# Patient Record
Sex: Female | Born: 1950
Health system: Southern US, Community
[De-identification: ages and names within clinical notes are randomized; demographics above are authoritative.]

## PROBLEM LIST (undated history)

## (undated) DIAGNOSIS — T7840XA Allergy, unspecified, initial encounter: Secondary | ICD-10-CM

## (undated) DIAGNOSIS — I10 Essential (primary) hypertension: Secondary | ICD-10-CM

## (undated) DIAGNOSIS — R011 Cardiac murmur, unspecified: Secondary | ICD-10-CM

## (undated) DIAGNOSIS — E785 Hyperlipidemia, unspecified: Secondary | ICD-10-CM

## (undated) DIAGNOSIS — M1990 Unspecified osteoarthritis, unspecified site: Secondary | ICD-10-CM

## (undated) DIAGNOSIS — K219 Gastro-esophageal reflux disease without esophagitis: Secondary | ICD-10-CM

## (undated) DIAGNOSIS — D649 Anemia, unspecified: Secondary | ICD-10-CM

## (undated) DIAGNOSIS — I341 Nonrheumatic mitral (valve) prolapse: Secondary | ICD-10-CM

## (undated) HISTORY — PX: OTHER SURGICAL HISTORY: SHX169

## (undated) HISTORY — DX: Essential (primary) hypertension: I10

## (undated) HISTORY — PX: COLONOSCOPY: SHX174

## (undated) HISTORY — DX: Hyperlipidemia, unspecified: E78.5

## (undated) HISTORY — DX: Anemia, unspecified: D64.9

## (undated) HISTORY — DX: Gastro-esophageal reflux disease without esophagitis: K21.9

## (undated) HISTORY — DX: Nonrheumatic mitral (valve) prolapse: I34.1

## (undated) HISTORY — DX: Allergy, unspecified, initial encounter: T78.40XA

## (undated) HISTORY — DX: Cardiac murmur, unspecified: R01.1

## (undated) HISTORY — PX: APPENDECTOMY: SHX54

## (undated) HISTORY — DX: Unspecified osteoarthritis, unspecified site: M19.90

---

## 2000-01-27 ENCOUNTER — Other Ambulatory Visit: Admission: RE | Admit: 2000-01-27 | Discharge: 2000-01-27 | Payer: Self-pay | Admitting: Obstetrics and Gynecology

## 2000-09-11 ENCOUNTER — Encounter: Payer: Self-pay | Admitting: Emergency Medicine

## 2000-09-11 ENCOUNTER — Emergency Department (HOSPITAL_COMMUNITY): Admission: EM | Admit: 2000-09-11 | Discharge: 2000-09-11 | Payer: Self-pay | Admitting: Emergency Medicine

## 2001-02-13 ENCOUNTER — Other Ambulatory Visit: Admission: RE | Admit: 2001-02-13 | Discharge: 2001-02-13 | Payer: Self-pay | Admitting: Obstetrics and Gynecology

## 2001-09-06 ENCOUNTER — Other Ambulatory Visit: Admission: RE | Admit: 2001-09-06 | Discharge: 2001-09-06 | Payer: Self-pay | Admitting: Obstetrics and Gynecology

## 2002-02-14 ENCOUNTER — Other Ambulatory Visit: Admission: RE | Admit: 2002-02-14 | Discharge: 2002-02-14 | Payer: Self-pay | Admitting: Obstetrics and Gynecology

## 2002-07-23 ENCOUNTER — Other Ambulatory Visit: Admission: RE | Admit: 2002-07-23 | Discharge: 2002-07-23 | Payer: Self-pay | Admitting: Obstetrics and Gynecology

## 2003-04-15 ENCOUNTER — Other Ambulatory Visit: Admission: RE | Admit: 2003-04-15 | Discharge: 2003-04-15 | Payer: Self-pay | Admitting: Obstetrics and Gynecology

## 2003-04-16 ENCOUNTER — Encounter: Payer: Self-pay | Admitting: Obstetrics and Gynecology

## 2003-04-16 ENCOUNTER — Ambulatory Visit (HOSPITAL_COMMUNITY): Admission: RE | Admit: 2003-04-16 | Discharge: 2003-04-16 | Payer: Self-pay | Admitting: Obstetrics and Gynecology

## 2003-05-22 ENCOUNTER — Emergency Department (HOSPITAL_COMMUNITY): Admission: EM | Admit: 2003-05-22 | Discharge: 2003-05-22 | Payer: Self-pay | Admitting: Emergency Medicine

## 2003-05-22 ENCOUNTER — Encounter: Payer: Self-pay | Admitting: Emergency Medicine

## 2004-05-17 ENCOUNTER — Other Ambulatory Visit: Admission: RE | Admit: 2004-05-17 | Discharge: 2004-05-17 | Payer: Self-pay | Admitting: Obstetrics and Gynecology

## 2005-08-20 ENCOUNTER — Emergency Department (HOSPITAL_COMMUNITY): Admission: EM | Admit: 2005-08-20 | Discharge: 2005-08-20 | Payer: Self-pay | Admitting: Emergency Medicine

## 2005-12-16 ENCOUNTER — Emergency Department (HOSPITAL_COMMUNITY): Admission: EM | Admit: 2005-12-16 | Discharge: 2005-12-16 | Payer: Self-pay | Admitting: Emergency Medicine

## 2006-10-22 ENCOUNTER — Emergency Department (HOSPITAL_COMMUNITY): Admission: EM | Admit: 2006-10-22 | Discharge: 2006-10-23 | Payer: Self-pay | Admitting: Emergency Medicine

## 2007-02-13 ENCOUNTER — Other Ambulatory Visit: Admission: RE | Admit: 2007-02-13 | Discharge: 2007-02-13 | Payer: Self-pay | Admitting: Obstetrics & Gynecology

## 2007-10-09 ENCOUNTER — Emergency Department (HOSPITAL_COMMUNITY): Admission: EM | Admit: 2007-10-09 | Discharge: 2007-10-10 | Payer: Self-pay | Admitting: Emergency Medicine

## 2008-02-25 ENCOUNTER — Emergency Department (HOSPITAL_COMMUNITY): Admission: EM | Admit: 2008-02-25 | Discharge: 2008-02-25 | Payer: Self-pay | Admitting: Emergency Medicine

## 2008-05-27 ENCOUNTER — Emergency Department (HOSPITAL_COMMUNITY): Admission: EM | Admit: 2008-05-27 | Discharge: 2008-05-27 | Payer: Self-pay | Admitting: Family Medicine

## 2009-03-30 ENCOUNTER — Emergency Department (HOSPITAL_COMMUNITY): Admission: EM | Admit: 2009-03-30 | Discharge: 2009-03-30 | Payer: Self-pay | Admitting: Family Medicine

## 2009-04-12 ENCOUNTER — Emergency Department (HOSPITAL_COMMUNITY): Admission: EM | Admit: 2009-04-12 | Discharge: 2009-04-12 | Payer: Self-pay | Admitting: Emergency Medicine

## 2009-09-24 ENCOUNTER — Encounter: Admission: RE | Admit: 2009-09-24 | Discharge: 2009-09-24 | Payer: Self-pay | Admitting: Orthopaedic Surgery

## 2009-09-30 ENCOUNTER — Emergency Department (HOSPITAL_COMMUNITY): Admission: EM | Admit: 2009-09-30 | Discharge: 2009-09-30 | Payer: Self-pay | Admitting: Emergency Medicine

## 2011-06-14 ENCOUNTER — Encounter: Payer: Self-pay | Admitting: Gastroenterology

## 2011-06-21 ENCOUNTER — Encounter: Payer: Self-pay | Admitting: Gastroenterology

## 2011-06-21 ENCOUNTER — Ambulatory Visit (INDEPENDENT_AMBULATORY_CARE_PROVIDER_SITE_OTHER): Payer: 59 | Admitting: Gastroenterology

## 2011-06-21 VITALS — BP 124/76 | HR 80 | Ht 65.0 in | Wt 178.0 lb

## 2011-06-21 DIAGNOSIS — K625 Hemorrhage of anus and rectum: Secondary | ICD-10-CM

## 2011-06-21 MED ORDER — PEG-KCL-NACL-NASULF-NA ASC-C 100 G PO SOLR: 1.0000 | Freq: Once | ORAL | Status: DC

## 2011-06-21 MED ORDER — HYDROCORTISONE ACETATE 25 MG RE SUPP: 25.0000 mg | Freq: Two times a day (BID) | RECTAL | Status: DC

## 2011-06-21 NOTE — Assessment & Plan Note (Addendum)
Rectal bleeding is most likely secondary to hemorrhoids. A more proximal colonic bleeding source should be ruled out.  Recommendations #1 Anusol HC suppositories #2 colonoscopy  Risks, alternatives, and complications of the procedure, including bleeding, perforation, and possible need for surgery, were explained to the patient.  Patient's questions were answered.

## 2011-06-21 NOTE — Progress Notes (Signed)
History of Present Illness:  Ms. Drum is a pleasant 60 year old African American female referred at the request of Dr. Clelia Croft for evaluation of rectal bleeding. On several occasions she has had limited painless bright red blood per rectum. She denies change in bowel habits, abdominal pain or rectal pain. She apparently underwent colonoscopy approximately 9 years ago.    Review of Systems: She has low back pain some urinary leakage Pertinent positive and negative review of systems were noted in the above HPI section. All other review of systems were otherwise negative.    Current Medications, Allergies, Past Medical History, Past Surgical History, Family History and Social History were reviewed in [employer] [facility] electronic medical record  Vital signs were reviewed in today's medical record. Physical Exam: General: Well developed , well nourished, no acute distress Head: Normocephalic and atraumatic Eyes:  sclerae anicteric, EOMI Ears: Normal auditory acuity Mouth: No deformity or lesions Lungs: Clear throughout to auscultation Heart: Regular rate and rhythm; no murmurs, rubs or bruits Abdomen: Soft, non tender and non distended. No masses, hepatosplenomegaly or hernias noted. Normal Bowel sounds Rectal: Small external hemorrhoids are present Musculoskeletal: Symmetrical with no gross deformities  Pulses:  Normal pulses noted Extremities: No clubbing, cyanosis, edema or deformities noted Neurological: Alert oriented x 4, grossly nonfocal Psychological:  Alert and cooperative. Normal mood and affect

## 2011-06-21 NOTE — Patient Instructions (Signed)
Colon [facility] 07/07/11 2:00 pm arrive at 1:00 pm on 4th floor Moviprep sent to pharmacy for you to pick up. Colonoscopy A colonoscopy is an exam to evaluate your entire colon. In this exam, your colon is cleansed. A long fiberoptic tube is inserted through your rectum and into your colon. The fiberoptic scope (endoscope) is a long bundle of enclosed and very flexible fibers. These fibers transmit light to the area examined and send images from that area to your caregiver. Discomfort is usually minimal. You may be given a drug to help you sleep (sedative) during or prior to the procedure. This exam helps to detect lumps (tumors), polyps, inflammation, and areas of bleeding. Your caregiver may also take a small piece of tissue (biopsy) that will be examined under a microscope. BEFORE THE PROCEDURE  A clear liquid diet may be required for 2 days before the exam.   Liquid injections (enemas) or laxatives may be required.   A large amount of electrolyte solution may be given to you to drink over a short period of time. This solution is used to clean out your colon.   You should be present 1 hour prior to your procedure or as directed by your caregiver.   Check in at the admissions desk to fill out necessary forms if not preregistered. There will be consent forms to sign prior to the procedure. If accompanied by friends or family, there is a waiting area for them while you are having your procedure.  LET YOUR CAREGIVER KNOW ABOUT:  Allergies to food or medicine.  Medicines taken, including vitamins, herbs, eyedrops, over-the-counter medicines, and creams.   Use of steroids (by mouth or creams).   Previous problems with anesthetics or numbing medicines.   History of bleeding problems or blood clots.  Previous surgery.   Other health problems, including diabetes and kidney problems.   Possibility of pregnancy, if this applies.   AFTER THE PROCEDURE  If you received a sedative and/or pain  medicine, you will need to arrange for someone to drive you home.   Occasionally, there is a little blood passed with the first bowel movement. DO NOT be concerned.  HOME CARE INSTRUCTIONS  It is not unusual to pass moderate amounts of gas and experience mild abdominal cramping following the procedure. This is due to air being used to inflate your colon during the exam. Walking or a warm pack on your belly (abdomen) may help.   You may resume all normal meals and activities after sedatives and medicines have worn off.   Only take over-the-counter or prescription medicines for pain, discomfort, or fever as directed by your caregiver. DO NOT use aspirin or blood thinners if a biopsy was taken. Consult your caregiver for medicine usage if biopsies were taken.  FINDING OUT THE RESULTS OF YOUR TEST Not all test results are available during your visit. If your test results are not back during the visit, make an appointment with your caregiver to find out the results. Do not assume everything is normal if you have not heard from your caregiver or the medical facility. It is important for you to follow up on all of your test results. SEEK IMMEDIATE MEDICAL CARE IF:  You have an oral temperature above 100.0, not controlled by medicine.   You pass large blood clots or fill a toilet with blood following the procedure. This may also occur 10 to 14 days following the procedure. This is more likely if a biopsy was taken.  You develop abdominal pain that keeps getting worse and cannot be relieved with medicine.  Document Released: 08/25/2000 Document Re-Released: 11/22/2009 [facility] Patient Information 2011 [place], [place].

## 2011-07-07 ENCOUNTER — Other Ambulatory Visit: Payer: 59 | Admitting: Gastroenterology

## 2011-08-20 ENCOUNTER — Emergency Department (HOSPITAL_COMMUNITY)
Admission: EM | Admit: 2011-08-20 | Discharge: 2011-08-21 | Disposition: A | Discharge status: Home / Self Care | Payer: 59 | Attending: Emergency Medicine | Admitting: Emergency Medicine

## 2011-08-20 ENCOUNTER — Other Ambulatory Visit: Payer: Self-pay

## 2011-08-20 ENCOUNTER — Encounter (HOSPITAL_COMMUNITY): Payer: Self-pay | Admitting: *Deleted

## 2011-08-20 ENCOUNTER — Emergency Department (HOSPITAL_COMMUNITY): Payer: 59

## 2011-08-20 DIAGNOSIS — R5383 Other fatigue: Secondary | ICD-10-CM | POA: Insufficient documentation

## 2011-08-20 DIAGNOSIS — R209 Unspecified disturbances of skin sensation: Secondary | ICD-10-CM | POA: Insufficient documentation

## 2011-08-20 DIAGNOSIS — E785 Hyperlipidemia, unspecified: Secondary | ICD-10-CM | POA: Insufficient documentation

## 2011-08-20 DIAGNOSIS — Z79899 Other long term (current) drug therapy: Secondary | ICD-10-CM | POA: Insufficient documentation

## 2011-08-20 DIAGNOSIS — R5381 Other malaise: Secondary | ICD-10-CM | POA: Insufficient documentation

## 2011-08-20 DIAGNOSIS — R200 Anesthesia of skin: Secondary | ICD-10-CM

## 2011-08-20 DIAGNOSIS — R51 Headache: Secondary | ICD-10-CM | POA: Insufficient documentation

## 2011-08-20 DIAGNOSIS — I10 Essential (primary) hypertension: Secondary | ICD-10-CM | POA: Insufficient documentation

## 2011-08-20 LAB — POCT I-STAT, CHEM 8
Calcium, Ion: 1.19 mmol/L (ref 1.12–1.32)
Chloride: 106 mEq/L (ref 96–112)
Glucose, Bld: 126 mg/dL — ABNORMAL HIGH (ref 70–99)

## 2011-08-20 LAB — DIFFERENTIAL
Basophils Absolute: 0 10*3/uL (ref 0.0–0.1)
Basophils Relative: 1 % (ref 0–1)
Lymphs Abs: 2.6 10*3/uL (ref 0.7–4.0)
Neutrophils Relative %: 40 % — ABNORMAL LOW (ref 43–77)

## 2011-08-20 LAB — CBC
HCT: 37.1 % (ref 36.0–46.0)
Hemoglobin: 12.4 g/dL (ref 12.0–15.0)
MCH: 29.3 pg (ref 26.0–34.0)
WBC: 5.7 10*3/uL (ref 4.0–10.5)

## 2011-08-20 NOTE — Progress Notes (Signed)
60 year old female has had some twitching of her left eye for the last week. This morning she had an episode where she had a dull pain in the left side of her face which seem to be centered behind her left eye. This lasted for about 40 minutes before resolving. She's had no further pain. You do difficulty talking and didn't noted no facial droop. She denies any arm or leg symptoms. Her exam is completely normal with no evidence of any cranial nerve deficits. CT scan is negative. The specific etiology is unclear at this point however I am considering the possibility that this is the early stages of trigeminal neuralgia or Bell's palsy. However, with no ongoing symptoms, and no treatment is indicated at this point. I have explained this to the patient and advised her of signs and symptoms which might lead to diagnosis of either trigeminal neuralgia or Bell's palsy.

## 2011-08-20 NOTE — ED Provider Notes (Signed)
History     CSN: [id number] Arrival date & time: 08/20/2011  9:53 PM   First MD Initiated Contact with Patient 08/20/11 2206      Chief Complaint  Patient presents with  . Facial Pain    (Consider location/radiation/quality/duration/timing/severity/associated sxs/prior treatment) HPI Comments: Belinda Morgan reports 30 minutes of left eye and cheek bone discomfort, without visual disturbance, ear pain, sinus congestion, rhinitis.  No peripheral weakness.  No ataxia.  No headache, but she does state during the 30 minute episode she felt generally weak.  The history is provided by the patient.    Past Medical History  Diagnosis Date  . GERD (gastroesophageal reflux disease)   . Hypertension   . Hyperlipemia   . Anemia     Hx of     Past Surgical History  Procedure Date  . Appendectomy     Family History  Problem Relation Age of Onset  . Diabetes Brother     x 3   . Colon cancer Neg Hx     History  Substance Use Topics  . Smoking status: Former Games developer  . Smokeless tobacco: Never Used  . Alcohol Use: No    OB History    Grav Para Term Preterm Abortions TAB SAB Ect Mult Living                  Review of Systems  HENT: Negative for congestion, sore throat, facial swelling, rhinorrhea and neck pain.   Eyes: Negative.  Negative for visual disturbance.  Respiratory: Negative.   Cardiovascular: Negative for chest pain.  Gastrointestinal: Negative.  Negative for nausea, vomiting and diarrhea.  Genitourinary: Negative for dysuria and vaginal bleeding.  Skin: Negative.   Neurological: Positive for weakness. Negative for dizziness, seizures and numbness.  Hematological: Negative.   Psychiatric/Behavioral: Negative.     Allergies  Review of patient's allergies indicates no known allergies.  Home Medications   Current Outpatient Rx  Name Route Sig Dispense Refill  . IBUPROFEN 200 MG PO CAPS Oral Take 1 capsule by mouth as needed.      Marland Kitchen TRAMADOL HCL 50 MG PO TABS  Oral Take 50 mg by mouth every 8 (eight) hours as needed.      . TRIAMTERENE-HCTZ 37.5-25 MG PO CAPS Oral Take 1 capsule by mouth every morning.        BP 137/71  Pulse 87  Temp(Src) 98.7 F (37.1 C) (Oral)  Resp 18  SpO2 100%  Physical Exam  ED Course  Procedures (including critical care time)  Labs Reviewed  DIFFERENTIAL - Abnormal; Notable for the following:    Neutrophils Relative 40 (*)    Lymphocytes Relative 47 (*)    All other components within normal limits  POCT I-STAT, CHEM 8 - Abnormal; Notable for the following:    Creatinine, Ser 1.20 (*)    Glucose, Bld 126 (*)    All other components within normal limits  CBC  I-STAT, CHEM 8  URINALYSIS, ROUTINE W REFLEX MICROSCOPIC   Ct Head Wo Contrast  08/20/2011  *RADIOLOGY REPORT*  Clinical Data: Headache with left facial numbness  CT HEAD WITHOUT CONTRAST  Technique:  Contiguous axial images were obtained from the base of the skull through the vertex without contrast.  Comparison: No comparison studies available.  Findings:  There is no evidence for acute hemorrhage, hydrocephalus, mass lesion, or abnormal extra-axial fluid collection.  No definite CT evidence for acute infarction.  The visualized paranasal sinuses and mastoid air cells are clear.  IMPRESSION: Normal exam.  Original Report Authenticated By: ERIC A. MANSELL, M.D.     1. Numbness of face       MDM  TIA, bells palsey , trigeminal neurologia        Arman Filter, NP 08/21/11 0007  Arman Filter, NP 08/21/11 0008

## 2011-08-20 NOTE — ED Notes (Signed)
Left side facial pain,  Shooting behind eye,  Some nausea, and weakness,  History of high blood pressure,  Is compliant with medications

## 2011-08-21 LAB — URINALYSIS, ROUTINE W REFLEX MICROSCOPIC
Bilirubin Urine: NEGATIVE
Glucose, UA: NEGATIVE mg/dL
Nitrite: NEGATIVE
Protein, ur: NEGATIVE mg/dL
pH: 5.5 (ref 5.0–8.0)

## 2011-08-21 LAB — URINE MICROSCOPIC-ADD ON

## 2011-08-21 NOTE — ED Provider Notes (Signed)
I have personally performed and participated in all the services and procedures documented herein. I have reviewed the findings with the patient.   Dione Booze, MD 08/21/11 [phone]

## 2011-11-29 ENCOUNTER — Encounter (HOSPITAL_COMMUNITY): Payer: Self-pay | Admitting: Emergency Medicine

## 2011-11-29 ENCOUNTER — Emergency Department (HOSPITAL_COMMUNITY)
Admission: EM | Admit: 2011-11-29 | Discharge: 2011-11-29 | Disposition: A | Discharge status: Home / Self Care | Payer: 59 | Source: Home / Self Care

## 2011-11-29 DIAGNOSIS — J069 Acute upper respiratory infection, unspecified: Secondary | ICD-10-CM

## 2011-11-29 MED ORDER — GUAIFENESIN-CODEINE 100-10 MG/5ML PO SYRP
ORAL_SOLUTION | ORAL | Status: AC
Start: 2011-11-29 — End: 2011-12-09

## 2011-11-29 NOTE — Discharge Instructions (Signed)
Increase fluids and rest. Advil or Tylenol as needed for discomfort. Return if symptoms change or worsen for recheck.  Upper Respiratory Infection, Adult An upper respiratory infection (URI) is also known as the common cold. It is often caused by a type of germ (virus). Colds are easily spread (contagious). You can pass it to others by kissing, coughing, sneezing, or drinking out of the same glass. Usually, you get better in 1 or 2 weeks.  HOME CARE   Only take medicine as told by your doctor.   Use a warm mist humidifier or breathe in steam from a hot shower.   Drink enough water and fluids to keep your pee (urine) clear or pale yellow.   Get plenty of rest.   Return to work when your temperature is back to normal or as told by your doctor. You may use a face mask and wash your hands to stop your cold from spreading.  GET HELP RIGHT AWAY IF:   After the first few days, you feel you are getting worse.   You have questions about your medicine.   You have chills, shortness of breath, or brown or red spit (mucus).   You have yellow or brown snot (nasal discharge) or pain in the face, especially when you bend forward.   You have a fever, puffy (swollen) neck, pain when you swallow, or white spots in the back of your throat.   You have a bad headache, ear pain, sinus pain, or chest pain.   You have a high-pitched whistling sound when you breathe in and out (wheezing).   You have a lasting cough or cough up blood.   You have sore muscles or a stiff neck.  MAKE SURE YOU:   Understand these instructions.   Will watch your condition.   Will get help right away if you are not doing well or get worse.  Document Released: 02/14/2008 Document Revised: 08/17/2011 Document Reviewed: 01/02/2011 [facility] Patient Information 2012 [place], [place].

## 2011-11-29 NOTE — ED Notes (Signed)
Pt complains of cold, coughs, chills, headaches, and a scratchy throat starting yesterday morning. Pt hasn't taken anything.

## 2011-11-29 NOTE — ED Provider Notes (Signed)
History     CSN: [id number]  Arrival date & time 11/29/11  1520   None     Chief Complaint  Patient presents with  . URI  . Cough  . Chills    (Consider location/radiation/quality/duration/timing/severity/associated sxs/prior treatment) HPI Comments: Patient presents today with complaints of a nonproductive cough, nasal congestion, scratchy throat, chills and headache. Symptoms began yesterday. She denies sinus pressure, sore throat or ear pain. She has not taken anything for her symptoms.   Past Medical History  Diagnosis Date  . GERD (gastroesophageal reflux disease)   . Hypertension   . Hyperlipemia   . Anemia     Hx of     Past Surgical History  Procedure Date  . Appendectomy     Family History  Problem Relation Age of Onset  . Diabetes Brother     x 3   . Colon cancer Neg Hx     History  Substance Use Topics  . Smoking status: Former Smoker    Quit date: 09/06/1996  . Smokeless tobacco: Never Used  . Alcohol Use: No    OB History    Grav Para Term Preterm Abortions TAB SAB Ect Mult Living                  Review of Systems  Constitutional: Positive for chills. Negative for fever and fatigue.  HENT: Positive for congestion and rhinorrhea. Negative for ear pain, sore throat, postnasal drip and sinus pressure.   Respiratory: Positive for cough. Negative for shortness of breath and wheezing.   Cardiovascular: Negative for chest pain.  Gastrointestinal: Negative for vomiting and diarrhea.    Allergies  Aspirin  Home Medications   Current Outpatient Rx  Name Route Sig Dispense Refill  . IBUPROFEN 200 MG PO CAPS Oral Take 1 capsule by mouth as needed.      Marland Kitchen TRAMADOL HCL 50 MG PO TABS Oral Take 50 mg by mouth every 8 (eight) hours as needed.      . TRIAMTERENE-HCTZ 37.5-25 MG PO CAPS Oral Take 1 capsule by mouth every morning.      . GUAIFENESIN-CODEINE 100-10 MG/5ML PO SYRP  1-2 tsp every 6 hrs prn cough 120 mL 0    BP 144/85  Pulse 86   Temp(Src) 99.9 F (37.7 C) (Oral)  Resp 16  SpO2 96%  Physical Exam  Nursing note and vitals reviewed. Constitutional: She appears well-developed and well-nourished. She has a sickly appearance. No distress.  HENT:  Head: Normocephalic and atraumatic.  Right Ear: Tympanic membrane, external ear and ear canal normal.  Left Ear: Tympanic membrane, external ear and ear canal normal.  Nose: Nose normal.  Mouth/Throat: Uvula is midline, oropharynx is clear and moist and mucous membranes are normal. No oropharyngeal exudate, posterior oropharyngeal edema or posterior oropharyngeal erythema.  Neck: Neck supple.  Cardiovascular: Normal rate, regular rhythm and normal heart sounds.   Pulmonary/Chest: Effort normal and breath sounds normal. No respiratory distress.  Lymphadenopathy:    She has no cervical adenopathy.  Neurological: She is alert.  Skin: Skin is warm and dry.  Psychiatric: She has a normal mood and affect.    ED Course  Procedures (including critical care time)  Labs Reviewed - No data to display No results found.   1. Acute URI       MDM          Melody Comas, PA 11/29/11 1635

## 2011-11-30 NOTE — ED Provider Notes (Signed)
Medical screening examination/treatment/procedure(s) were performed by resident physician or non-physician practitioner and as supervising physician I was immediately available for consultation/collaboration.   Keora Eccleston DOUGLAS MD.    Arinze Rivadeneira D Celvin Taney, MD 11/30/11 2113 

## 2011-12-04 ENCOUNTER — Encounter: Payer: Self-pay | Admitting: Gastroenterology

## 2011-12-28 ENCOUNTER — Telehealth: Payer: Self-pay | Admitting: *Deleted

## 2011-12-28 NOTE — Telephone Encounter (Signed)
No show for previsit.  Unable to reach at home number.  Sent no show letter

## 2012-01-11 ENCOUNTER — Encounter: Payer: 59 | Admitting: Gastroenterology

## 2012-04-09 ENCOUNTER — Encounter: Payer: Self-pay | Admitting: Internal Medicine

## 2012-05-09 ENCOUNTER — Ambulatory Visit (AMBULATORY_SURGERY_CENTER): Payer: 59 | Admitting: *Deleted

## 2012-05-09 VITALS — Ht 65.5 in | Wt 185.5 lb

## 2012-05-09 DIAGNOSIS — Z1211 Encounter for screening for malignant neoplasm of colon: Secondary | ICD-10-CM

## 2012-05-09 DIAGNOSIS — Z8371 Family history of colonic polyps: Secondary | ICD-10-CM

## 2012-05-09 MED ORDER — NA SULFATE-K SULFATE-MG SULF 17.5-3.13-1.6 GM/177ML PO SOLN: 1.0000 | Freq: Once | ORAL | Status: DC

## 2012-05-09 NOTE — Progress Notes (Signed)
No allergies to eggs or soy products  Last procedure done in [place]; pt unsure of MD's name and states she didn't have polyps removed

## 2012-05-22 ENCOUNTER — Emergency Department (INDEPENDENT_AMBULATORY_CARE_PROVIDER_SITE_OTHER): Payer: 59

## 2012-05-22 ENCOUNTER — Encounter (HOSPITAL_COMMUNITY): Payer: Self-pay | Admitting: *Deleted

## 2012-05-22 ENCOUNTER — Emergency Department (HOSPITAL_COMMUNITY)
Admission: EM | Admit: 2012-05-22 | Discharge: 2012-05-22 | Disposition: A | Discharge status: Home / Self Care | Payer: 59 | Source: Home / Self Care | Attending: Emergency Medicine | Admitting: Emergency Medicine

## 2012-05-22 DIAGNOSIS — R05 Cough: Secondary | ICD-10-CM

## 2012-05-22 DIAGNOSIS — R059 Cough, unspecified: Secondary | ICD-10-CM

## 2012-05-22 MED ORDER — ALBUTEROL SULFATE HFA 108 (90 BASE) MCG/ACT IN AERS: 1.0000 | INHALATION_SPRAY | Freq: Four times a day (QID) | RESPIRATORY_TRACT | Status: DC | PRN

## 2012-05-22 NOTE — ED Notes (Signed)
Pt  Has  A   persistant  Cough  Which  She  Has  Had  For  sev  Weeks     It was  Thought that  It  Was  Caused  By  Her BP  meds  Which  herPCP   Has  Changed    -  Her last  Dose  Was    3  Days  Ago  But the  Cough is  Now  Productive    And  persistant    -  She  Has  A  New  RX   For norvasc  Which  She  Has   Not   Filled  Yet     - She    Also  Report   Her      PCP  Rx her   Codeine  Based  Cough   Med

## 2012-05-22 NOTE — ED Provider Notes (Signed)
History     CSN: [id number]  Arrival date & time 05/22/12  1500   First MD Initiated Contact with Patient 05/22/12 1509      Chief Complaint  Patient presents with  . Cough    (Consider location/radiation/quality/duration/timing/severity/associated sxs/prior treatment) HPI Comments: Patient presents urgent care she has been experiencing a constant dry cough for several weeks. She believes that initially it was suspected that maybe one of her blood pressure medicines was causing this. She discontinued temporarily. She later resumed her blood pressure medicine again and her cough we started him to worse but now she's experiencing some phlegm with it. She has not taken the medicine for the last 3 days. She was called and a cough suppressant along with a new blood pressure medicine a moderate pain which she has not started yet. Patient denies any upper congestion, fevers body aches or other constitutional symptoms.  Patient is a 61 y.o. female presenting with cough. The history is provided by the patient.  Cough This is a new problem. The problem occurs constantly. The cough is non-productive. Pertinent negatives include no chest pain, no chills, no weight loss, no ear congestion, no ear pain, no headaches, no myalgias, no shortness of breath and no wheezing.    Past Medical History  Diagnosis Date  . GERD (gastroesophageal reflux disease)   . Hypertension   . Hyperlipemia   . Anemia     Hx of   . Heart murmur   . MVP (mitral valve prolapse)     hx of    Past Surgical History  Procedure Date  . Appendectomy   . Laporoscopy     diagnostic    Family History  Problem Relation Age of Onset  . Diabetes Brother     x 3   . Colon cancer Neg Hx   . Esophageal cancer Neg Hx   . Rectal cancer Neg Hx   . Stomach cancer Neg Hx   . Colon polyps Brother     History  Substance Use Topics  . Smoking status: Former Smoker    Quit date: 09/06/1996  . Smokeless tobacco: Never Used    . Alcohol Use: No    OB History    Grav Para Term Preterm Abortions TAB SAB Ect Mult Living                  Review of Systems  Constitutional: Negative for chills, weight loss and activity change.  HENT: Negative for ear pain.   Respiratory: Positive for cough. Negative for shortness of breath and wheezing.   Cardiovascular: Negative for chest pain.  Musculoskeletal: Negative for myalgias.  Skin: Negative for rash.  Neurological: Negative for dizziness, light-headedness and headaches.    Allergies  Aspirin and E-mycin  Home Medications   Current Outpatient Rx  Name Route Sig Dispense Refill  . NORVASC PO Oral Take by mouth.    . ALBUTEROL SULFATE HFA 108 (90 BASE) MCG/ACT IN AERS Inhalation Inhale 1-2 puffs into the lungs every 6 (six) hours as needed. 1 Inhaler 0  . NA SULFATE-K SULFATE-MG SULF PO SOLN Oral Take 1 kit by mouth once. 354 mL 0  . SIMETHICONE 125 MG PO CHEW Oral Chew 125 mg by mouth every 6 (six) hours as needed.    Marland Kitchen TRAMADOL HCL 50 MG PO TABS Oral Take 50 mg by mouth every 8 (eight) hours as needed.        BP 163/83  Pulse 72  Temp 98.5  F (36.9 C) (Oral)  Resp 20  SpO2 98%  Physical Exam  Nursing note and vitals reviewed. Constitutional: Vital signs are normal. She appears well-developed and well-nourished.  Non-toxic appearance. She does not have a sickly appearance. She does not appear ill. No distress.  HENT:  Head: Normocephalic.  Eyes: Conjunctivae normal are normal.  Neck: No JVD present.  Cardiovascular: Normal rate.  Exam reveals no gallop and no friction rub.   No murmur heard. Pulmonary/Chest: Effort normal and breath sounds normal. No respiratory distress. She has no decreased breath sounds. She has no wheezes. She has no rhonchi. She has no rales. She exhibits no tenderness.  Lymphadenopathy:    She has no cervical adenopathy.  Neurological: She is alert.  Skin: Skin is warm. No rash noted.    ED Course  Procedures (including  critical care time)  Labs Reviewed - No data to display Dg Chest 2 View  05/22/2012  *RADIOLOGY REPORT*  Clinical Data: Cough, chest congestion  CHEST - 2 VIEW  Comparison: 10/09/2007  Findings: Borderline cardiomegaly again noted.  No acute infiltrate or pulmonary edema.  Bony thorax is stable.  IMPRESSION: No active disease.  Borderline cardiomegaly again noted.   Original Report Authenticated By: Natasha Mead, M.D.      1. Cough       MDM  Dry cough most likely secondary to ACE inhibitor. X-rays were unremarkable and have encouraged patient to discontinue ACE inhibitor and use albuterol every 6-8 hours as needed for reactive ongoing cough. Patient is afebrile with normal respiratory exam and mildly hypertensive but comfortable        Jimmie Molly, MD 05/22/12 1714

## 2012-05-24 ENCOUNTER — Encounter: Payer: 59 | Admitting: Internal Medicine

## 2012-06-11 ENCOUNTER — Encounter: Payer: Self-pay | Admitting: Internal Medicine

## 2012-06-11 ENCOUNTER — Ambulatory Visit (AMBULATORY_SURGERY_CENTER): Payer: 59 | Admitting: Internal Medicine

## 2012-06-11 VITALS — BP 142/85 | HR 82 | Temp 98.2°F | Resp 18 | Ht 65.5 in | Wt 185.0 lb

## 2012-06-11 DIAGNOSIS — D126 Benign neoplasm of colon, unspecified: Secondary | ICD-10-CM

## 2012-06-11 DIAGNOSIS — D129 Benign neoplasm of anus and anal canal: Secondary | ICD-10-CM

## 2012-06-11 DIAGNOSIS — D128 Benign neoplasm of rectum: Secondary | ICD-10-CM

## 2012-06-11 DIAGNOSIS — Z1211 Encounter for screening for malignant neoplasm of colon: Secondary | ICD-10-CM

## 2012-06-11 MED ORDER — SODIUM CHLORIDE 0.9 % IV SOLN: 500.0000 mL | INTRAVENOUS | Status: DC

## 2012-06-11 NOTE — Patient Instructions (Addendum)
Discharge instructions given with verbal understanding. Handouts on polyps,diverticulosis and hemorrhoids given. Resume previous medications. Hold aspirin and aspirin products for one week. YOU HAD AN ENDOSCOPIC PROCEDURE TODAY AT THE [facility]: Refer to the procedure report that was given to you for any specific questions about what was found during the examination.  If the procedure report does not answer your questions, please call your gastroenterologist to clarify.  If you requested that your care partner not be given the details of your procedure findings, then the procedure report has been included in a sealed envelope for you to review at your convenience later.  YOU SHOULD EXPECT: Some feelings of bloating in the abdomen. Passage of more gas than usual.  Walking can help get rid of the air that was put into your GI tract during the procedure and reduce the bloating. If you had a lower endoscopy (such as a colonoscopy or flexible sigmoidoscopy) you may notice spotting of blood in your stool or on the toilet paper. If you underwent a bowel prep for your procedure, then you may not have a normal bowel movement for a few days.  DIET: Your first meal following the procedure should be a light meal and then it is ok to progress to your normal diet.  A half-sandwich or bowl of soup is an example of a good first meal.  Heavy or fried foods are harder to digest and may make you feel nauseous or bloated.  Likewise meals heavy in dairy and vegetables can cause extra gas to form and this can also increase the bloating.  Drink plenty of fluids but you should avoid alcoholic beverages for 24 hours.  ACTIVITY: Your care partner should take you home directly after the procedure.  You should plan to take it easy, moving slowly for the rest of the day.  You can resume normal activity the day after the procedure however you should NOT DRIVE or use heavy machinery for 24 hours (because of the sedation  medicines used during the test).    SYMPTOMS TO REPORT IMMEDIATELY: A gastroenterologist can be reached at any hour.  During normal business hours, 8:30 AM to 5:00 PM Monday through Friday, call [phone].  After hours and on weekends, please call the GI answering service at [phone] who will take a message and have the physician on call contact you.   Following lower endoscopy (colonoscopy or flexible sigmoidoscopy):  Excessive amounts of blood in the stool  Significant tenderness or worsening of abdominal pains  Swelling of the abdomen that is new, acute  Fever of 100F or higher  FOLLOW UP: If any biopsies were taken you will be contacted by phone or by letter within the next 1-3 weeks.  Call your gastroenterologist if you have not heard about the biopsies in 3 weeks.  Our staff will call the home number listed on your records the next business day following your procedure to check on you and address any questions or concerns that you may have at that time regarding the information given to you following your procedure. This is a courtesy call and so if there is no answer at the home number and we have not heard from you through the emergency physician on call, we will assume that you have returned to your regular daily activities without incident.  SIGNATURES/CONFIDENTIALITY: You and/or your care partner have signed paperwork which will be entered into your electronic medical record.  These signatures attest to the fact that  that the information above on your After Visit Summary has been reviewed and is understood.  Full responsibility of the confidentiality of this discharge information lies with you and/or your care-partner.

## 2012-06-11 NOTE — Progress Notes (Signed)
The pt tolerated the colonoscopy very well. Maw   

## 2012-06-11 NOTE — Op Note (Signed)
[facility] [address] [place], [address]   COLONOSCOPY PROCEDURE REPORT  PATIENT: Belinda, Morgan  MR#: [id number] BIRTHDATE: 1950-12-09 , 61  yrs. old GENDER: Female ENDOSCOPIST: Beverley Fiedler, MD REFERRED XB:JYNW, Robert Bellow PROCEDURE DATE:  06/11/2012 PROCEDURE:   Colonoscopy with biopsy, Colonoscopy with snare polypectomy, and Colonoscopy with cold biopsy polypectomy ASA CLASS:   Class II INDICATIONS:average risk screening and last colonoscopy performed 10 years ago. MEDICATIONS: MAC sedation, administered by CRNA and propofol (Diprivan) 450mg  IV  DESCRIPTION OF PROCEDURE:   After the risks benefits and alternatives of the procedure were thoroughly explained, informed consent was obtained.  A digital rectal exam revealed external hemorrhoids.   The LB [id number]  endoscope was introduced through the anus and advanced to the cecum, which was identified by both the appendix and ileocecal valve. No adverse events experienced.   The quality of the prep was Suprep good  The instrument was then slowly withdrawn as the colon was fully examined.   COLON FINDINGS: A sessile polyp measuring 6 mm in size was found at the cecum.  A polypectomy was performed with a cold snare.  The resection was complete and the polyp tissue was completely retrieved.   Lipomatous IC valve.  Cold forcep biopsy performed to exclude adenomatous change.   Five sessile polyps ranging between 3-16mm in size were found in the ascending colon (2), descending colon (2), and sigmoid colon (1).  Polypectomy was performed with cold forceps.  All resections were complete and all polyp tissue was completely retrieved.   Four sessile polyps ranging between 3-17mm in size were found in the rectosigmoid colon, likely hyperplastic.  Polypectomy was performed with cold forceps (3) and using cold snare (1).  All resections were complete and all polyp tissue was completely retrieved.   A  sessile polyp measuring 5 mm in size was found in the rectum, which appeared adenomatous.  A polypectomy was performed with a cold snare.  The resection was complete and the polyp tissue was completely retrieved.   Mild diverticulosis was noted in the ascending colon. Retroflexed views revealed internal hemorrhoids. The time to cecum=3 minutes 57 seconds.  Withdrawal time=21 minutes 21 seconds.  The scope was withdrawn and the procedure completed.  COMPLICATIONS: There were no complications.  ENDOSCOPIC IMPRESSION: 1.   Sessile polyp measuring 6 mm in size was found at the cecum; polypectomy was performed with a cold snare 2.   Lipomatous IC valve.  Cold forcep biopsy performed to exclude adenomatous change. 3.   Five sessile polyps ranging between 3-30mm in size were found in the ascending colon, descending colon, and sigmoid colon; Polypectomy was performed with cold forceps 4.   Four sessile polyps ranging between 3-66mm in size were found in the rectosigmoid colon; Polypectomy was performed with cold forceps and using cold snare 5.   Sessile polyp measuring 5 mm in size was found in the rectum; polypectomy was performed with a cold snare 6.   Mild diverticulosis was noted in the ascending colon 7.   Small internal and external hemorrhoids  RECOMMENDATIONS: 1.  Hold aspirin, aspirin products, and anti-inflammatory medication for 1 week. 2.  Await pathology results 3.  High fiber diet 4.  Timing of repeat colonoscopy will be determined by pathology findings. 5.  You will receive a letter within 1-2 weeks with the results of your biopsy as well as final recommendations.  Please call my office if you have not received a  letter after 3 weeks.   eSigned:  Beverley Fiedler, MD 06/11/2012 11:15 AM   cc: Kari Baars, MD and The Patient   PATIENT NAME:  Belinda, Morgan MR#: [id number]

## 2012-06-11 NOTE — Progress Notes (Signed)
Patient did not experience any of the following events: a burn prior to discharge; a fall within the facility; wrong site/side/patient/procedure/implant event; or a hospital transfer or hospital admission upon discharge from the facility. (G8907) Patient did not have preoperative order for IV antibiotic SSI prophylaxis. (G8918)  

## 2012-06-11 NOTE — Progress Notes (Signed)
The pt said pre-sedation she had a cough and her MD gave her a inhaler to use.  She did not bring the inhaler with her today.  Per the pt "I feel like it has gone into a cold now". [facility], CRNA had the pt take a deep breather in and exhale slowly pre-sedation.  No coughing noted in the procedure room pre-sedation. Maw

## 2012-06-12 ENCOUNTER — Telehealth: Payer: Self-pay | Admitting: *Deleted

## 2012-06-12 NOTE — Telephone Encounter (Signed)
  Follow up Call-  Call back number 06/11/2012  Post procedure Call Back phone  # [phone]  Permission to leave phone message Yes     Patient questions:  Do you have a fever, pain , or abdominal swelling? no Pain Score  0 *  Have you tolerated food without any problems? yes  Have you been able to return to your normal activities? yes  Do you have any questions about your discharge instructions: Diet   no Medications  no Follow up visit  no  Do you have questions or concerns about your Care? no  Actions: * If pain score is 4 or above: No action needed, pain <4.  Denies any problems. Eyes better per patient.

## 2012-06-17 ENCOUNTER — Encounter: Payer: Self-pay | Admitting: Internal Medicine

## 2013-03-15 ENCOUNTER — Emergency Department (HOSPITAL_COMMUNITY)
Admission: EM | Admit: 2013-03-15 | Discharge: 2013-03-15 | Disposition: A | Discharge status: Home / Self Care | Payer: 59 | Source: Home / Self Care

## 2013-03-15 ENCOUNTER — Emergency Department (INDEPENDENT_AMBULATORY_CARE_PROVIDER_SITE_OTHER): Payer: 59

## 2013-03-15 DIAGNOSIS — M25512 Pain in left shoulder: Secondary | ICD-10-CM

## 2013-03-15 DIAGNOSIS — S46909A Unspecified injury of unspecified muscle, fascia and tendon at shoulder and upper arm level, unspecified arm, initial encounter: Secondary | ICD-10-CM

## 2013-03-15 DIAGNOSIS — M25519 Pain in unspecified shoulder: Secondary | ICD-10-CM

## 2013-03-15 DIAGNOSIS — S4980XA Other specified injuries of shoulder and upper arm, unspecified arm, initial encounter: Secondary | ICD-10-CM

## 2013-03-15 DIAGNOSIS — S46002A Unspecified injury of muscle(s) and tendon(s) of the rotator cuff of left shoulder, initial encounter: Secondary | ICD-10-CM

## 2013-03-15 NOTE — ED Provider Notes (Signed)
History    CSN: [id number] Arrival date & time 03/15/13  1402  None    Chief Complaint  Patient presents with  . Shoulder Injury   (Consider location/radiation/quality/duration/timing/severity/associated sxs/prior Treatment) HPI Comments: Belinda Morgan presents today with left shoulder pain. She reports falling down stairs landing on her buttocks but hitting the left shoulder as she landed. Severe pain along the humerus into the upper arm. Mild swelling is noted anteriorly and decrease ROM. Denies neck pain or radicular symptoms.   Patient is a 62 y.o. female presenting with shoulder injury.  Shoulder Injury   Past Medical History  Diagnosis Date  . GERD (gastroesophageal reflux disease)   . Hypertension   . Hyperlipemia   . Anemia     Hx of   . Heart murmur   . MVP (mitral valve prolapse)     hx of  . Arthritis    Past Surgical History  Procedure Laterality Date  . Appendectomy    . Laporoscopy      diagnostic   Family History  Problem Relation Age of Onset  . Diabetes Brother     x 3   . Colon cancer Neg Hx   . Esophageal cancer Neg Hx   . Rectal cancer Neg Hx   . Stomach cancer Neg Hx   . Colon polyps Brother    History  Substance Use Topics  . Smoking status: Former Smoker    Quit date: 09/06/1996  . Smokeless tobacco: Never Used  . Alcohol Use: No   OB History   Grav Para Term Preterm Abortions TAB SAB Ect Mult Living                 Review of Systems  All other systems reviewed and are negative.    Allergies  Aspirin and E-mycin  Home Medications   Current Outpatient Rx  Name  Route  Sig  Dispense  Refill  . AmLODIPine Besylate (NORVASC PO)   Oral   Take by mouth.         . traMADol (ULTRAM) 50 MG tablet   Oral   Take 50 mg by mouth every 8 (eight) hours as needed.           Marland Kitchen albuterol (PROVENTIL HFA;VENTOLIN HFA) 108 (90 BASE) MCG/ACT inhaler   Inhalation   Inhale 1-2 puffs into the lungs every 6 (six) hours as needed.   1  Inhaler   0    BP 122/79  Pulse 73  Temp(Src) 99.1 F (37.3 C) (Oral)  Resp 17  SpO2 96% Physical Exam  Constitutional: She appears well-developed and well-nourished. She appears distressed.  Mild distress secondary to pain  HENT:  Head: Normocephalic and atraumatic.  Neck: Normal range of motion. Neck supple.  Musculoskeletal:       Right shoulder: Normal. She exhibits normal range of motion, no tenderness, no bony tenderness, no swelling, no effusion, no crepitus, no pain, no spasm and normal strength.       Left shoulder: She exhibits decreased range of motion, tenderness, swelling, pain and spasm. She exhibits no effusion, no crepitus, no deformity and no laceration.  Left shoulder with pain to palpation anteriorly, mild soft tissue swelling is noted. Active ROM reduced in all directions. Passive restricted secondary to pain.    ED Course  Procedures (including critical care time) Labs Reviewed - No data to display Dg Shoulder Left  03/15/2013   *RADIOLOGY REPORT*  Clinical Data: Fall onto left shoulder  LEFT  SHOULDER - 2+ VIEW  Comparison: None.  Findings: No fracture or dislocation is seen.  Mild degenerative changes of the acromioclavicular joint.  Visualized left lung is clear.  IMPRESSION: No fracture or dislocation is seen.   Original Report Authenticated By: Charline Bills, M.D.   1. Shoulder pain, acute, left   2. Rotator cuff injury, left, initial encounter     MDM  Left shoulder pain and swelling s/p fall. High rising humeral head on x-ray may suggest RTC injury, and moderately decreased ROM. Pain medication, prn sling and f/u with Dr. Shelle Iron who is on call.   Azucena Fallen, PA-C 03/15/13 [phone]

## 2013-03-15 NOTE — ED Notes (Signed)
Patient states this morning she slided down the stairs on her butt.  Patient thinks she must hit the stairs with her left shoulder.  Patient states shoulder is swelling.  Took hot shower to see if she could get some relief

## 2013-03-15 NOTE — ED Provider Notes (Signed)
Medical screening examination/treatment/procedure(s) were performed by non-physician practitioner and as supervising physician I was immediately available for consultation/collaboration.  Kerrin Markman, M.D.  Caio Devera C Ronetta Molla, MD 03/15/13 1739 

## 2013-05-29 ENCOUNTER — Encounter (HOSPITAL_COMMUNITY): Payer: Self-pay | Admitting: Emergency Medicine

## 2013-05-29 ENCOUNTER — Emergency Department (HOSPITAL_COMMUNITY)
Admission: EM | Admit: 2013-05-29 | Discharge: 2013-05-29 | Disposition: A | Discharge status: Home / Self Care | Payer: 59 | Source: Home / Self Care | Attending: Emergency Medicine | Admitting: Emergency Medicine

## 2013-05-29 DIAGNOSIS — M542 Cervicalgia: Secondary | ICD-10-CM

## 2013-05-29 MED ORDER — KETOROLAC TROMETHAMINE 60 MG/2ML IM SOLN
INTRAMUSCULAR | Status: AC
  Filled 2013-05-29: qty 2

## 2013-05-29 MED ORDER — METHYLPREDNISOLONE ACETATE 40 MG/ML IJ SUSP
INTRAMUSCULAR | Status: AC
  Filled 2013-05-29: qty 5

## 2013-05-29 MED ORDER — KETOROLAC TROMETHAMINE 60 MG/2ML IM SOLN
60.0000 mg | Freq: Once | INTRAMUSCULAR | Status: AC
  Administered 2013-05-29: 60 mg via INTRAMUSCULAR

## 2013-05-29 MED ORDER — METHYLPREDNISOLONE ACETATE 40 MG/ML IJ SUSP
40.0000 mg | Freq: Once | INTRAMUSCULAR | Status: AC
  Administered 2013-05-29: 40 mg via INTRAMUSCULAR

## 2013-05-29 MED ORDER — NAPROXEN 500 MG PO TABS: 500.0000 mg | ORAL_TABLET | Freq: Two times a day (BID) | ORAL | Status: DC | PRN

## 2013-05-29 MED ORDER — METHOCARBAMOL 500 MG PO TABS: 500.0000 mg | ORAL_TABLET | Freq: Three times a day (TID) | ORAL | Status: DC | PRN

## 2013-05-29 NOTE — ED Notes (Signed)
C/o neck and shoulder pain due to playing with granddaughter.

## 2013-05-29 NOTE — ED Provider Notes (Signed)
CSN: [id number]     Arrival date & time 05/29/13  1757 History   First MD Initiated Contact with Patient 05/29/13 1836     Chief Complaint  Patient presents with  . Neck Pain   (Consider location/radiation/quality/duration/timing/severity/associated sxs/prior Treatment) HPI Comments: 62 year old female presents complaining of shoulder and neck pain for the past week, gradually improving. This began on Sunday morning. She states that Saturday morning, she gave her grandson AP back right. She did not have any immediate pain at the time but this was a significantly increased level of activity for her. She has pain in the muscles of her shoulders and neck. The area is tender to touch as well and feels like sore muscles. Pain is exacerbated by stretching the neck or. The pain is relieved by Advil or tramadol.  Patient is a 62 y.o. female presenting with neck pain.  Neck Pain Associated symptoms: no chest pain, no fever and no weakness     Past Medical History  Diagnosis Date  . GERD (gastroesophageal reflux disease)   . Hypertension   . Hyperlipemia   . Anemia     Hx of   . Heart murmur   . MVP (mitral valve prolapse)     hx of  . Arthritis    Past Surgical History  Procedure Laterality Date  . Appendectomy    . Laporoscopy      diagnostic   Family History  Problem Relation Age of Onset  . Diabetes Brother     x 3   . Colon cancer Neg Hx   . Esophageal cancer Neg Hx   . Rectal cancer Neg Hx   . Stomach cancer Neg Hx   . Colon polyps Brother    History  Substance Use Topics  . Smoking status: Former Smoker    Quit date: 09/06/1996  . Smokeless tobacco: Never Used  . Alcohol Use: No   OB History   Grav Para Term Preterm Abortions TAB SAB Ect Mult Living                 Review of Systems  Constitutional: Negative for fever and chills.  HENT: Positive for neck pain.   Eyes: Negative for visual disturbance.  Respiratory: Negative for cough and shortness of breath.    Cardiovascular: Negative for chest pain, palpitations and leg swelling.  Gastrointestinal: Negative for nausea, vomiting and abdominal pain.  Endocrine: Negative for polydipsia and polyuria.  Genitourinary: Negative for dysuria, urgency and frequency.  Musculoskeletal: Positive for myalgias. Negative for arthralgias.  Skin: Negative for rash.  Neurological: Negative for dizziness, weakness and light-headedness.    Allergies  Aspirin and E-mycin  Home Medications   Current Outpatient Rx  Name  Route  Sig  Dispense  Refill  . EXPIRED: albuterol (PROVENTIL HFA;VENTOLIN HFA) 108 (90 BASE) MCG/ACT inhaler   Inhalation   Inhale 1-2 puffs into the lungs every 6 (six) hours as needed.   1 Inhaler   0   . AmLODIPine Besylate (NORVASC PO)   Oral   Take by mouth.         . methocarbamol (ROBAXIN) 500 MG tablet   Oral   Take 1 tablet (500 mg total) by mouth 3 (three) times daily as needed.   20 tablet   0   . naproxen (NAPROSYN) 500 MG tablet   Oral   Take 1 tablet (500 mg total) by mouth 2 (two) times daily as needed.   60 tablet   0   .  traMADol (ULTRAM) 50 MG tablet   Oral   Take 50 mg by mouth every 8 (eight) hours as needed.            BP 149/93  Pulse 80  Temp(Src) 98.1 F (36.7 C) (Oral)  Resp 16  SpO2 98% Physical Exam  Nursing note and vitals reviewed. Constitutional: She is oriented to person, place, and time. Vital signs are normal. She appears well-developed and well-nourished. No distress.  HENT:  Head: Normocephalic and atraumatic.  Neck: Normal range of motion. Neck supple. Muscular tenderness present. No spinous process tenderness present. No rigidity. Normal range of motion present.  Pulmonary/Chest: Effort normal. No respiratory distress.  Neurological: She is alert and oriented to person, place, and time. She has normal strength. Coordination normal.  Skin: Skin is warm and dry. No rash noted. She is not diaphoretic.  Psychiatric: She has a  normal mood and affect. Judgment normal.    ED Course  Procedures (including critical care time) Labs Review Labs Reviewed - No data to display Imaging Review No results found.  MDM   1. Neck pain, bilateral    This is muscular soreness. Treating here, will discharge with Naprosyn and Robaxin as Robaxin has worked well for her in the past. She can continue taking her tramadol as needed. Followup if not improving.   Meds ordered this encounter  Medications  . ketorolac (TORADOL) injection 60 mg    Sig:   . methylPREDNISolone acetate (DEPO-MEDROL) injection 40 mg    Sig:   . methocarbamol (ROBAXIN) 500 MG tablet    Sig: Take 1 tablet (500 mg total) by mouth 3 (three) times daily as needed.    Dispense:  20 tablet    Refill:  0  . naproxen (NAPROSYN) 500 MG tablet    Sig: Take 1 tablet (500 mg total) by mouth 2 (two) times daily as needed.    Dispense:  60 tablet    Refill:  0     Graylon Good, PA-C 05/29/13 1922

## 2013-05-29 NOTE — ED Provider Notes (Signed)
Medical screening examination/treatment/procedure(s) were performed by non-physician practitioner and as supervising physician I was immediately available for consultation/collaboration.  Leslee Home, M.D.  Reuben Likes, MD 05/29/13 [phone]

## 2014-06-14 ENCOUNTER — Emergency Department (INDEPENDENT_AMBULATORY_CARE_PROVIDER_SITE_OTHER)
Admission: EM | Admit: 2014-06-14 | Discharge: 2014-06-14 | Disposition: A | Discharge status: Home / Self Care | Payer: 59 | Source: Home / Self Care

## 2014-06-14 ENCOUNTER — Encounter (HOSPITAL_COMMUNITY): Payer: Self-pay | Admitting: Emergency Medicine

## 2014-06-14 DIAGNOSIS — R1030 Lower abdominal pain, unspecified: Secondary | ICD-10-CM

## 2014-06-14 DIAGNOSIS — K639 Disease of intestine, unspecified: Secondary | ICD-10-CM

## 2014-06-14 LAB — POCT URINALYSIS DIP (DEVICE)
Bilirubin Urine: NEGATIVE
Glucose, UA: NEGATIVE mg/dL
KETONES UR: NEGATIVE mg/dL
LEUKOCYTES UA: NEGATIVE
Nitrite: NEGATIVE
PH: 5.5 (ref 5.0–8.0)
Protein, ur: 30 mg/dL — AB
SPECIFIC GRAVITY, URINE: 1.02 (ref 1.005–1.030)
Urobilinogen, UA: 0.2 mg/dL (ref 0.0–1.0)

## 2014-06-14 MED ORDER — HYOSCYAMINE SULFATE 0.125 MG SL SUBL: SUBLINGUAL_TABLET | SUBLINGUAL | Status: DC

## 2014-06-14 NOTE — ED Provider Notes (Signed)
CSN: [id number]     Arrival date & time 06/14/14  1826 History   First MD Initiated Contact with Patient 06/14/14 1842     Chief Complaint  Patient presents with  . Abdominal Pain   (Consider location/radiation/quality/duration/timing/severity/associated sxs/prior Treatment) HPI Comments: 63 year old female complaining of lower abdominal pain for 4 days. The pain is primarily across the mid and lower abdomen. It has been primarily intermittent. It occurs primarily when she is supine, especially in bed at night. During the day she has little to no abdominal discomfort. She believed that she may have been constipated and on Friday night, 2 nights ago she took herbal laxatives and had a very good result the following day. Since that time she had no abdominal discomfort or pain until she woke up from a nap this afternoon about 5:00. She then developed and "uneasiness" discomfort  th acrosse lower abdomen.  No associated fever, nausea, vomiting, diarrhea, diaphoresis or other symptoms.    Past Medical History  Diagnosis Date  . GERD (gastroesophageal reflux disease)   . Hypertension   . Hyperlipemia   . Anemia     Hx of   . Heart murmur   . MVP (mitral valve prolapse)     hx of  . Arthritis    Past Surgical History  Procedure Laterality Date  . Appendectomy    . Laporoscopy      diagnostic   Family History  Problem Relation Age of Onset  . Diabetes Brother     x 3   . Colon cancer Neg Hx   . Esophageal cancer Neg Hx   . Rectal cancer Neg Hx   . Stomach cancer Neg Hx   . Colon polyps Brother   . Cancer Mother    History  Substance Use Topics  . Smoking status: Former Smoker    Quit date: 09/06/1996  . Smokeless tobacco: Never Used  . Alcohol Use: No   OB History   Grav Para Term Preterm Abortions TAB SAB Ect Mult Living                 Review of Systems  Constitutional: Negative for fever, appetite change and fatigue.  HENT: Negative.   Respiratory: Negative for  cough, shortness of breath and wheezing.   Cardiovascular: Negative for chest pain, palpitations and leg swelling.  Gastrointestinal: Positive for abdominal pain and constipation. Negative for nausea, vomiting, diarrhea, blood in stool, abdominal distention and rectal pain.  Genitourinary: Negative.   Musculoskeletal: Negative.   Skin: Negative.   Neurological: Negative.   Psychiatric/Behavioral: Negative.     Allergies  Aspirin and E-mycin  Home Medications   Prior to Admission medications   Medication Sig Start Date End Date Taking? Authorizing Provider  albuterol (PROVENTIL HFA;VENTOLIN HFA) 108 (90 BASE) MCG/ACT inhaler Inhale 1-2 puffs into the lungs every 6 (six) hours as needed. 05/22/12 05/22/13  Rosana Hoes, MD  AmLODIPine Besylate (NORVASC PO) Take by mouth.    Historical Provider, MD  hyoscyamine (LEVSIN/SL) 0.125 MG SL tablet Place one tablet under the tongue every 4 to 6 hours prn abdominal pain. 06/14/14   Janne Napoleon, NP  methocarbamol (ROBAXIN) 500 MG tablet Take 1 tablet (500 mg total) by mouth 3 (three) times daily as needed. 05/29/13   Liam Graham, PA-C  naproxen (NAPROSYN) 500 MG tablet Take 1 tablet (500 mg total) by mouth 2 (two) times daily as needed. 05/29/13   Liam Graham, PA-C  traMADol (ULTRAM) 50 MG tablet  Take 50 mg by mouth every 8 (eight) hours as needed.      Historical Provider, MD   BP 140/75  Pulse 94  Temp(Src) 98.8 F (37.1 C) (Oral)  Resp 18  SpO2 96% Physical Exam  Nursing note and vitals reviewed. Constitutional: She is oriented to person, place, and time. She appears well-developed and well-nourished. No distress.  Patient is awake, alert and sitting on the edge of the exam table. She is smiling and has a relaxed posture. She is in no acute distress.  Cardiovascular: Normal rate, regular rhythm and normal heart sounds.   Pulmonary/Chest: Effort normal and breath sounds normal. No respiratory distress. She has no wheezes. She has no rales.   Abdominal: Soft. Bowel sounds are normal. She exhibits no distension and no mass. There is no tenderness. There is no rebound and no guarding.  Abdominal exam completely normal. Negative Murphy's. No organomegaly on palpation. No tenderness over the left lower quadrant. There is a right lower quadrant scar representing her past appendectomy.  Musculoskeletal: She exhibits no edema.  Neurological: She is alert and oriented to person, place, and time. She exhibits normal muscle tone.  Skin: Skin is dry.  Psychiatric: She has a normal mood and affect.    ED Course  Procedures (including critical care time) Labs Review Labs Reviewed  POCT URINALYSIS DIP (DEVICE) - Abnormal; Notable for the following:    Hgb urine dipstick MODERATE (*)    Protein, ur 30 (*)    All other components within normal limits    Imaging Review No results found.   MDM   1. Colonic disorder   2. Lower abdominal pain     Patient has had an appendectomy. Doubt cholestatic disease, splenic etiology, diverticulitis, intestinal obstruction, pelvic etiology, ischemic bowel. Possible residual effect of laxatives, consider colon spasm. Abdomen exam benign. Dietary instructions as discussed. Increase fluids Trial of Levsin If not improving 2 days see PCP. If worse, new sx's as we discussed go to the ED     Janne Napoleon, NP 06/14/14 [phone]

## 2014-06-14 NOTE — ED Notes (Addendum)
C/o lower abdominal pain onset Tuesday.  Worse at night when she lays down.  No fever.  No UTI symptoms.  No N,V or D. Hurts in low abdomen when she takes a deep breath.

## 2014-06-14 NOTE — Discharge Instructions (Signed)
Abdominal Pain, Women °Abdominal (stomach, pelvic, or belly) pain can be caused by many things. It is important to tell your doctor: °· The location of the pain. °· Does it come and go or is it present all the time? °· Are there things that start the pain (eating certain foods, exercise)? °· Are there other symptoms associated with the pain (fever, nausea, vomiting, diarrhea)? °All of this is helpful to know when trying to find the cause of the pain. °CAUSES  °· Stomach: virus or bacteria infection, or ulcer. °· Intestine: appendicitis (inflamed appendix), regional ileitis (Crohn's disease), ulcerative colitis (inflamed colon), irritable bowel syndrome, diverticulitis (inflamed diverticulum of the colon), or cancer of the stomach or intestine. °· Gallbladder disease or stones in the gallbladder. °· Kidney disease, kidney stones, or infection. °· Pancreas infection or cancer. °· Fibromyalgia (pain disorder). °· Diseases of the female organs: °¨ Uterus: fibroid (non-cancerous) tumors or infection. °¨ Fallopian tubes: infection or tubal pregnancy. °¨ Ovary: cysts or tumors. °¨ Pelvic adhesions (scar tissue). °¨ Endometriosis (uterus lining tissue growing in the pelvis and on the pelvic organs). °¨ Pelvic congestion syndrome (female organs filling up with blood just before the menstrual period). °¨ Pain with the menstrual period. °¨ Pain with ovulation (producing an egg). °¨ Pain with an IUD (intrauterine device, birth control) in the uterus. °¨ Cancer of the female organs. °· Functional pain (pain not caused by a disease, may improve without treatment). °· Psychological pain. °· Depression. °DIAGNOSIS  °Your doctor will decide the seriousness of your pain by doing an examination. °· Blood tests. °· X-rays. °· Ultrasound. °· CT scan (computed tomography, special type of X-ray). °· MRI (magnetic resonance imaging). °· Cultures, for infection. °· Barium enema (dye inserted in the large intestine, to better view it with  X-rays). °· Colonoscopy (looking in intestine with a lighted tube). °· Laparoscopy (minor surgery, looking in abdomen with a lighted tube). °· Major abdominal exploratory surgery (looking in abdomen with a large incision). °TREATMENT  °The treatment will depend on the cause of the pain.  °· Many cases can be observed and treated at home. °· Over-the-counter medicines recommended by your caregiver. °· Prescription medicine. °· Antibiotics, for infection. °· Birth control pills, for painful periods or for ovulation pain. °· Hormone treatment, for endometriosis. °· Nerve blocking injections. °· Physical therapy. °· Antidepressants. °· Counseling with a psychologist or psychiatrist. °· Minor or major surgery. °HOME CARE INSTRUCTIONS  °· Do not take laxatives, unless directed by your caregiver. °· Take over-the-counter pain medicine only if ordered by your caregiver. Do not take aspirin because it can cause an upset stomach or bleeding. °· Try a clear liquid diet (broth or water) as ordered by your caregiver. Slowly move to a bland diet, as tolerated, if the pain is related to the stomach or intestine. °· Have a thermometer and take your temperature several times a day, and record it. °· Bed rest and sleep, if it helps the pain. °· Avoid sexual intercourse, if it causes pain. °· Avoid stressful situations. °· Keep your follow-up appointments and tests, as your caregiver orders. °· If the pain does not go away with medicine or surgery, you may try: °¨ Acupuncture. °¨ Relaxation exercises (yoga, meditation). °¨ Group therapy. °¨ Counseling. °SEEK MEDICAL CARE IF:  °· You notice certain foods cause stomach pain. °· Your home care treatment is not helping your pain. °· You need stronger pain medicine. °· You want your IUD removed. °· You feel faint or   lightheaded. °· You develop nausea and vomiting. °· You develop a rash. °· You are having side effects or an allergy to your medicine. °SEEK IMMEDIATE MEDICAL CARE IF:  °· Your  pain does not go away or gets worse. °· You have a fever. °· Your pain is felt only in portions of the abdomen. The right side could possibly be appendicitis. The left lower portion of the abdomen could be colitis or diverticulitis. °· You are passing blood in your stools (bright red or black tarry stools, with or without vomiting). °· You have blood in your urine. °· You develop chills, with or without a fever. °· You pass out. °MAKE SURE YOU:  °· Understand these instructions. °· Will watch your condition. °· Will get help right away if you are not doing well or get worse. °Document Released: 06/25/2007 Document Revised: 01/12/2014 Document Reviewed: 07/15/2009 °[employer]® Patient Information ©2015 [employer]. This information is not intended to replace advice given to you by your health care provider. Make sure you discuss any questions you have with your health care provider. ° °

## 2014-06-16 NOTE — ED Provider Notes (Signed)
Medical screening examination/treatment/procedure(s) were performed by resident physician or non-physician practitioner and as supervising physician I was immediately available for consultation/collaboration.   Pauline Good MD.   Billy Fischer, MD 06/16/14 1131

## 2015-04-08 ENCOUNTER — Ambulatory Visit (INDEPENDENT_AMBULATORY_CARE_PROVIDER_SITE_OTHER): Payer: 59 | Admitting: Physician Assistant

## 2015-04-08 VITALS — BP 122/86 | HR 72 | Temp 98.0°F | Resp 16 | Ht 65.0 in | Wt 181.2 lb

## 2015-04-08 DIAGNOSIS — S46811A Strain of other muscles, fascia and tendons at shoulder and upper arm level, right arm, initial encounter: Secondary | ICD-10-CM

## 2015-04-08 DIAGNOSIS — S39012A Strain of muscle, fascia and tendon of lower back, initial encounter: Secondary | ICD-10-CM

## 2015-04-08 MED ORDER — CYCLOBENZAPRINE HCL 5 MG PO TABS: 5.0000 mg | ORAL_TABLET | Freq: Every day | ORAL | Status: DC
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
## 2015-04-08 NOTE — Patient Instructions (Signed)
Use advil as needed for pain. Do not use any other products with this containing ibuprofen, naprosyn or aspirin. You MAY use tylenol with this. Take flexeril at night - this is a muscle relaxer. You may double dose if needed. Heat, gentle massage and gentle stretching can help. Remain active, as inactivity can cause more pain. Don't do anything too strenuous though. If you develop new numbness, weakness or incontinence of urine or bowel, return to clinic or go to the emergency room ASAP. Return if your symptoms are not improving in 10 days.

## 2015-07-12 ENCOUNTER — Other Ambulatory Visit: Payer: Self-pay | Admitting: Obstetrics and Gynecology

## 2015-07-13 LAB — CYTOLOGY - PAP

## 2015-10-18 DIAGNOSIS — Z6830 Body mass index (BMI) 30.0-30.9, adult: Secondary | ICD-10-CM | POA: Diagnosis not present

## 2015-10-18 DIAGNOSIS — M25572 Pain in left ankle and joints of left foot: Secondary | ICD-10-CM | POA: Diagnosis not present

## 2015-10-18 MED FILL — AMLODIPINE BESYLATE 5 MG TA: 5 | 90 days supply | Qty: 90 | Fill #0

## 2015-10-20 MED FILL — TRIAMTERENE/HCTZ 37.5/25 CP: 37.5-25 | 30 days supply | Qty: 30 | Fill #2

## 2015-11-03 DIAGNOSIS — M19172 Post-traumatic osteoarthritis, left ankle and foot: Secondary | ICD-10-CM | POA: Diagnosis not present

## 2015-11-03 DIAGNOSIS — M25472 Effusion, left ankle: Secondary | ICD-10-CM | POA: Diagnosis not present

## 2015-11-03 DIAGNOSIS — M25572 Pain in left ankle and joints of left foot: Secondary | ICD-10-CM | POA: Diagnosis not present

## 2015-11-03 DIAGNOSIS — M79671 Pain in right foot: Secondary | ICD-10-CM | POA: Diagnosis not present

## 2015-12-21 MED FILL — TRIAMTERENE/HCTZ 37.5/25 CP: 37.5-25 | 30 days supply | Qty: 30 | Fill #0

## 2016-02-02 MED FILL — TRIAMTERENE/HCTZ 37.5/25 CP: 37.5-25 | 30 days supply | Qty: 30 | Fill #1

## 2016-03-23 MED FILL — AMLODIPINE BESYLATE 5 MG TA: 5 | 90 days supply | Qty: 90 | Fill #1

## 2016-03-23 MED FILL — TRIAMTERENE/HCTZ 37.5/25 CP: 37.5-25 | 30 days supply | Qty: 30 | Fill #2

## 2016-05-08 DIAGNOSIS — Z1231 Encounter for screening mammogram for malignant neoplasm of breast: Secondary | ICD-10-CM | POA: Diagnosis not present

## 2016-05-08 DIAGNOSIS — Z803 Family history of malignant neoplasm of breast: Secondary | ICD-10-CM | POA: Diagnosis not present

## 2016-05-19 MED FILL — TRIAMTERENE/HCTZ 37.5/25 CP: 37.5-25 | 30 days supply | Qty: 30 | Fill #3

## 2016-07-06 MED FILL — TRIAMTERENE/HCTZ 37.5/25 CP: 37.5-25 | 30 days supply | Qty: 30 | Fill #4

## 2016-07-13 DIAGNOSIS — Z0000 Encounter for general adult medical examination without abnormal findings: Secondary | ICD-10-CM | POA: Diagnosis not present

## 2016-07-13 DIAGNOSIS — R7301 Impaired fasting glucose: Secondary | ICD-10-CM | POA: Diagnosis not present

## 2016-07-13 DIAGNOSIS — R8299 Other abnormal findings in urine: Secondary | ICD-10-CM | POA: Diagnosis not present

## 2016-07-20 DIAGNOSIS — E784 Other hyperlipidemia: Secondary | ICD-10-CM | POA: Diagnosis not present

## 2016-07-20 DIAGNOSIS — Z1389 Encounter for screening for other disorder: Secondary | ICD-10-CM | POA: Diagnosis not present

## 2016-07-20 DIAGNOSIS — M5416 Radiculopathy, lumbar region: Secondary | ICD-10-CM | POA: Diagnosis not present

## 2016-07-20 DIAGNOSIS — R7301 Impaired fasting glucose: Secondary | ICD-10-CM | POA: Diagnosis not present

## 2016-07-20 DIAGNOSIS — R209 Unspecified disturbances of skin sensation: Secondary | ICD-10-CM | POA: Diagnosis not present

## 2016-07-20 DIAGNOSIS — I10 Essential (primary) hypertension: Secondary | ICD-10-CM | POA: Diagnosis not present

## 2016-07-20 DIAGNOSIS — N182 Chronic kidney disease, stage 2 (mild): Secondary | ICD-10-CM | POA: Diagnosis not present

## 2016-07-20 DIAGNOSIS — Z6830 Body mass index (BMI) 30.0-30.9, adult: Secondary | ICD-10-CM | POA: Diagnosis not present

## 2016-07-20 DIAGNOSIS — Z0000 Encounter for general adult medical examination without abnormal findings: Secondary | ICD-10-CM | POA: Diagnosis not present

## 2016-07-20 MED FILL — EZETIMIBE 10 MG TABLET: 10 | 30 days supply | Qty: 30 | Fill #0

## 2016-08-09 DIAGNOSIS — H524 Presbyopia: Secondary | ICD-10-CM | POA: Diagnosis not present

## 2016-08-18 MED FILL — TRIAMTERENE/HCTZ 37.5/25 CP: 37.5-25 | 30 days supply | Qty: 30 | Fill #5

## 2016-08-25 DIAGNOSIS — Z1212 Encounter for screening for malignant neoplasm of rectum: Secondary | ICD-10-CM | POA: Diagnosis not present

## 2016-09-22 MED FILL — AMLODIPINE BESYLATE 5 MG TA: 5 | 30 days supply | Qty: 30 | Fill #0

## 2016-09-22 MED FILL — TRIAMTERENE/HCTZ 37.5/25 CP: 37.5-25 | 30 days supply | Qty: 30 | Fill #6

## 2016-10-30 MED FILL — TRIAMTERENE/HCTZ 37.5/25 CP: 37.5-25 | 30 days supply | Qty: 30 | Fill #0 | Status: TO

## 2016-10-30 MED FILL — AMLODIPINE BESYLATE 5 MG TA: 5 | 30 days supply | Qty: 30 | Fill #1

## 2016-12-01 MED FILL — TRIAMTERENE/HCTZ 37.5/25 CP: 37.5-25 | 30 days supply | Qty: 30 | Fill #1 | Status: TO

## 2016-12-01 MED FILL — AMLODIPINE BESYLATE 5 MG TA: 5 | 30 days supply | Qty: 30 | Fill #2

## 2017-01-09 MED FILL — AMLODIPINE BESYLATE 5 MG TA: 5 | 30 days supply | Qty: 30 | Fill #3

## 2017-01-09 MED FILL — TRIAMTERENE/HCTZ 37.5/25 CP: 37.5-25 | 30 days supply | Qty: 30 | Fill #2 | Status: TO

## 2017-02-19 MED FILL — TRIAMTERENE/HCTZ 37.5/25 CP: 37.5-25 | 30 days supply | Qty: 30 | Fill #3 | Status: TO

## 2017-02-19 MED FILL — AMLODIPINE BESYLATE 5 MG TA: 5 | 30 days supply | Qty: 30 | Fill #4

## 2017-03-21 MED FILL — TRIAMTERENE/HCTZ 37.5/25 CP: 37.5-25 | 30 days supply | Qty: 30 | Fill #4 | Status: TO

## 2017-03-21 MED FILL — AMLODIPINE BESYLATE 5 MG TA: 5 | 30 days supply | Qty: 30 | Fill #5

## 2017-04-16 ENCOUNTER — Encounter: Payer: Self-pay | Admitting: *Deleted

## 2017-04-17 ENCOUNTER — Encounter: Payer: Self-pay | Admitting: Internal Medicine

## 2017-04-24 MED FILL — TRIAMTERENE/HCTZ 37.5/25 CP: 37.5-25 | 60 days supply | Qty: 60 | Fill #5 | Status: TO

## 2017-04-24 MED FILL — AMLODIPINE BESYLATE 5 MG TA: 5 | 90 days supply | Qty: 90 | Fill #0 | Status: TO

## 2017-05-08 DIAGNOSIS — Z1231 Encounter for screening mammogram for malignant neoplasm of breast: Secondary | ICD-10-CM | POA: Diagnosis not present

## 2017-06-01 ENCOUNTER — Ambulatory Visit (AMBULATORY_SURGERY_CENTER): Payer: Self-pay | Admitting: *Deleted

## 2017-06-01 VITALS — Ht 65.0 in | Wt 188.0 lb

## 2017-06-01 DIAGNOSIS — Z86010 Personal history of colonic polyps: Secondary | ICD-10-CM

## 2017-06-01 MED ORDER — NA SULFATE-K SULFATE-MG SULF 17.5-3.13-1.6 GM/177ML PO SOLN
1.0000 | Freq: Once | ORAL | 0 refills | Status: AC
Start: 2017-06-01 — End: 2017-06-01

## 2017-06-01 NOTE — Progress Notes (Signed)
Denies allergies to eggs or soy products. Denies complications with sedation or anesthesia. Denies O2 use. Denies use of diet or weight loss medications.  Emmi instructions given for colonoscopy.  

## 2017-06-14 ENCOUNTER — Encounter: Payer: 59 | Admitting: Internal Medicine

## 2017-06-18 DIAGNOSIS — Z23 Encounter for immunization: Secondary | ICD-10-CM | POA: Diagnosis not present

## 2017-07-24 DIAGNOSIS — I10 Essential (primary) hypertension: Secondary | ICD-10-CM | POA: Diagnosis not present

## 2017-07-24 DIAGNOSIS — E7849 Other hyperlipidemia: Secondary | ICD-10-CM | POA: Diagnosis not present

## 2017-07-24 DIAGNOSIS — R7301 Impaired fasting glucose: Secondary | ICD-10-CM | POA: Diagnosis not present

## 2017-07-31 DIAGNOSIS — E7849 Other hyperlipidemia: Secondary | ICD-10-CM | POA: Diagnosis not present

## 2017-07-31 DIAGNOSIS — R7301 Impaired fasting glucose: Secondary | ICD-10-CM | POA: Diagnosis not present

## 2017-07-31 DIAGNOSIS — I341 Nonrheumatic mitral (valve) prolapse: Secondary | ICD-10-CM | POA: Diagnosis not present

## 2017-07-31 DIAGNOSIS — Z23 Encounter for immunization: Secondary | ICD-10-CM | POA: Diagnosis not present

## 2017-07-31 DIAGNOSIS — R0600 Dyspnea, unspecified: Secondary | ICD-10-CM | POA: Diagnosis not present

## 2017-07-31 DIAGNOSIS — I10 Essential (primary) hypertension: Secondary | ICD-10-CM | POA: Diagnosis not present

## 2017-07-31 DIAGNOSIS — Z0000 Encounter for general adult medical examination without abnormal findings: Secondary | ICD-10-CM | POA: Diagnosis not present

## 2017-07-31 DIAGNOSIS — N182 Chronic kidney disease, stage 2 (mild): Secondary | ICD-10-CM | POA: Diagnosis not present

## 2017-07-31 DIAGNOSIS — Z6830 Body mass index (BMI) 30.0-30.9, adult: Secondary | ICD-10-CM | POA: Diagnosis not present

## 2017-08-08 ENCOUNTER — Other Ambulatory Visit: Payer: Self-pay | Admitting: Internal Medicine

## 2017-08-08 DIAGNOSIS — E785 Hyperlipidemia, unspecified: Secondary | ICD-10-CM

## 2017-08-11 DIAGNOSIS — J209 Acute bronchitis, unspecified: Secondary | ICD-10-CM | POA: Diagnosis not present

## 2017-08-11 DIAGNOSIS — J45909 Unspecified asthma, uncomplicated: Secondary | ICD-10-CM | POA: Diagnosis not present

## 2017-08-14 ENCOUNTER — Other Ambulatory Visit: Payer: Medicare Other

## 2017-08-17 ENCOUNTER — Telehealth: Payer: Self-pay | Admitting: Internal Medicine

## 2017-08-20 ENCOUNTER — Encounter: Payer: Medicare Other | Admitting: Internal Medicine

## 2017-08-28 ENCOUNTER — Other Ambulatory Visit: Payer: Self-pay

## 2017-08-28 ENCOUNTER — Ambulatory Visit (AMBULATORY_SURGERY_CENTER): Payer: Self-pay | Admitting: *Deleted

## 2017-08-28 VITALS — Ht 65.5 in | Wt 186.0 lb

## 2017-08-28 DIAGNOSIS — R319 Hematuria, unspecified: Secondary | ICD-10-CM | POA: Insufficient documentation

## 2017-08-28 DIAGNOSIS — Z86010 Personal history of colonic polyps: Secondary | ICD-10-CM

## 2017-08-28 NOTE — Progress Notes (Signed)
Patient denies any allergies to eggs or soy. Patient denies any problems with anesthesia/sedation. Patient denies any oxygen use at home. Patient denies taking any diet/weight loss medications or blood thinners. Patient has EMMI from last pre-visit. Patient has Suprep kit at home.  

## 2017-09-17 ENCOUNTER — Other Ambulatory Visit: Payer: Medicare Other

## 2017-09-18 ENCOUNTER — Encounter: Payer: Self-pay | Admitting: Internal Medicine

## 2017-09-18 ENCOUNTER — Other Ambulatory Visit: Payer: Self-pay

## 2017-09-18 ENCOUNTER — Ambulatory Visit (AMBULATORY_SURGERY_CENTER): Payer: Medicare Other | Admitting: Internal Medicine

## 2017-09-18 VITALS — BP 103/65 | HR 66 | Temp 99.3°F | Resp 15 | Ht 65.0 in | Wt 186.0 lb

## 2017-09-18 DIAGNOSIS — K219 Gastro-esophageal reflux disease without esophagitis: Secondary | ICD-10-CM | POA: Diagnosis not present

## 2017-09-18 DIAGNOSIS — I10 Essential (primary) hypertension: Secondary | ICD-10-CM | POA: Diagnosis not present

## 2017-09-18 DIAGNOSIS — D124 Benign neoplasm of descending colon: Secondary | ICD-10-CM | POA: Diagnosis not present

## 2017-09-18 DIAGNOSIS — D122 Benign neoplasm of ascending colon: Secondary | ICD-10-CM | POA: Diagnosis not present

## 2017-09-18 DIAGNOSIS — D123 Benign neoplasm of transverse colon: Secondary | ICD-10-CM

## 2017-09-18 DIAGNOSIS — Z86010 Personal history of colonic polyps: Secondary | ICD-10-CM

## 2017-09-18 DIAGNOSIS — D125 Benign neoplasm of sigmoid colon: Secondary | ICD-10-CM

## 2017-09-18 MED ORDER — SODIUM CHLORIDE 0.9 % IV SOLN: 500.0000 mL | Freq: Once | INTRAVENOUS | Status: DC

## 2017-09-18 NOTE — Progress Notes (Signed)
Pt's states no medical or surgical changes since previsit or office visit. 

## 2017-09-18 NOTE — Progress Notes (Signed)
Called to room to assist during endoscopic procedure.  Patient ID and intended procedure confirmed with present staff. Received instructions for my participation in the procedure from the performing physician.  

## 2017-09-18 NOTE — Progress Notes (Signed)
Report given to PACU, vss 

## 2017-09-18 NOTE — Patient Instructions (Signed)
**  Handouts given on polyps**   YOU HAD AN ENDOSCOPIC PROCEDURE TODAY: Refer to the procedure report and other information in the discharge instructions given to you for any specific questions about what was found during the examination. If this information does not answer your questions, please call [place] office at [phone] to clarify.   YOU SHOULD EXPECT: Some feelings of bloating in the abdomen. Passage of more gas than usual. Walking can help get rid of the air that was put into your GI tract during the procedure and reduce the bloating. If you had a lower endoscopy (such as a colonoscopy or flexible sigmoidoscopy) you may notice spotting of blood in your stool or on the toilet paper. Some abdominal soreness may be present for a day or two, also.  DIET: Your first meal following the procedure should be a light meal and then it is ok to progress to your normal diet. A half-sandwich or bowl of soup is an example of a good first meal. Heavy or fried foods are harder to digest and may make you feel nauseous or bloated. Drink plenty of fluids but you should avoid alcoholic beverages for 24 hours. If you had a esophageal dilation, please see attached instructions for diet.    ACTIVITY: Your care partner should take you home directly after the procedure. You should plan to take it easy, moving slowly for the rest of the day. You can resume normal activity the day after the procedure however YOU SHOULD NOT DRIVE, use power tools, machinery or perform tasks that involve climbing or major physical exertion for 24 hours (because of the sedation medicines used during the test).   SYMPTOMS TO REPORT IMMEDIATELY: A gastroenterologist can be reached at any hour. Please call [phone]  for any of the following symptoms:  Following lower endoscopy (colonoscopy, flexible sigmoidoscopy) Excessive amounts of blood in the stool  Significant tenderness, worsening of abdominal pains  Swelling of the abdomen  that is new, acute  Fever of 100 or higher    FOLLOW UP:  If any biopsies were taken you will be contacted by phone or by letter within the next 1-3 weeks. Call [phone]  if you have not heard about the biopsies in 3 weeks.  Please also call with any specific questions about appointments or follow up tests.  

## 2017-09-18 NOTE — Op Note (Signed)
Belinda Morgan: Belinda Morgan Procedure Date: 09/18/2017 2:40 PM MRN: [id number] Endoscopist: Jerene Bears , MD Age: 67 Referring MD:  Date of Birth: 07/03/1951 Gender: Female Account #: [id number] Procedure:                Colonoscopy Indications:              Surveillance: Personal history of adenomatous                            polyps on last colonoscopy 5 years ago Medicines:                Monitored Anesthesia Care Procedure:                Pre-Anesthesia Assessment:                           - Prior to the procedure, a History and Physical                            was performed, and patient medications and                            allergies were reviewed. The patient's tolerance of                            previous anesthesia was also reviewed. The risks                            and benefits of the procedure and the sedation                            options and risks were discussed with the patient.                            All questions were answered, and informed consent                            was obtained. Prior Anticoagulants: The patient has                            taken no previous anticoagulant or antiplatelet                            agents. ASA Grade Assessment: III - A patient with                            severe systemic disease. After reviewing the risks                            and benefits, the patient was deemed in                            satisfactory condition to undergo the procedure.  After obtaining informed consent, the colonoscope                            was passed under direct vision. Throughout the                            procedure, the patient's blood pressure, pulse, and                            oxygen saturations were monitored continuously. The                            Colonoscope was introduced through the anus and                            advanced to the the cecum,  identified by                            appendiceal orifice and ileocecal valve. The                            colonoscopy was performed without difficulty. The                            patient tolerated the procedure well. The quality                            of the bowel preparation was good. The ileocecal                            valve, appendiceal orifice, and rectum were                            photographed. Scope In: 3:07:00 PM Scope Out: 3:25:56 PM Scope Withdrawal Time: 0 hours 14 minutes 5 seconds  Total Procedure Duration: 0 hours 18 minutes 56 seconds  Findings:                 The perianal exam findings include non-thrombosed                            internal hemorrhoids.                           The ileocecal valve was lipomatous.                           Two sessile polyps were found in the ascending                            colon. The polyps were 3 to 5 mm in size. These                            polyps were removed with a cold snare. Resection  and retrieval were complete.                           A 5 mm polyp was found in the transverse colon. The                            polyp was sessile. The polyp was removed with a                            cold snare. Resection and retrieval were complete.                           A 5 mm polyp was found in the descending colon. The                            polyp was sessile. The polyp was removed with a                            cold snare. Resection and retrieval were complete.                           A 4 mm polyp was found in the sigmoid colon. The                            polyp was sessile. The polyp was removed with a                            cold snare. Resection and retrieval were complete.                           A diffuse area of moderate melanosis was found in                            the entire colon.                           External and internal hemorrhoids  were found during                            retroflexion and during digital exam. The                            hemorrhoids were medium-sized. Complications:            No immediate complications. Estimated Blood Loss:     Estimated blood loss was minimal. Impression:               - Two 3 to 5 mm polyps in the ascending colon,                            removed with a cold snare. Resected and retrieved.                           -  One 5 mm polyp in the transverse colon, removed                            with a cold snare. Resected and retrieved.                           - One 5 mm polyp in the descending colon, removed                            with a cold snare. Resected and retrieved.                           - One 4 mm polyp in the sigmoid colon, removed with                            a cold snare. Resected and retrieved.                           - Melanosis in the colon.                           - External and internal hemorrhoids. Recommendation:           - Patient has a contact number available for                            emergencies. The signs and symptoms of potential                            delayed complications were discussed with the                            patient. Return to normal activities tomorrow.                            Written discharge instructions were provided to the                            patient.                           - Resume previous diet.                           - Continue present medications.                           - Await pathology results.                           - Repeat colonoscopy is recommended for                            surveillance. The colonoscopy date will be  determined after pathology results from today's                            exam become available for review. Jerene Bears, MD 09/18/2017 3:31:50 PM This report has been signed electronically.

## 2017-09-19 ENCOUNTER — Telehealth: Payer: Self-pay

## 2017-09-19 NOTE — Telephone Encounter (Signed)
  Follow up Call-  Call back number 09/18/2017  Post procedure Call Back phone  # [phone]  Permission to leave phone message Yes  Some recent data might be hidden     Patient questions:  Do you have a fever, pain , or abdominal swelling? No. Pain Score  0 *  Have you tolerated food without any problems? Yes.    Have you been able to return to your normal activities? Yes.    Do you have any questions about your discharge instructions: Diet   No. Medications  No. Follow up visit  No.  Do you have questions or concerns about your Care? No.  Actions: * If pain score is 4 or above: No action needed, pain <4.

## 2017-09-25 ENCOUNTER — Encounter: Payer: Self-pay | Admitting: Internal Medicine

## 2017-10-12 ENCOUNTER — Ambulatory Visit
Admission: RE | Admit: 2017-10-12 | Discharge: 2017-10-12 | Disposition: A | Discharge status: Home / Self Care | Payer: Medicare Other | Source: Ambulatory Visit | Attending: Internal Medicine | Admitting: Internal Medicine

## 2017-10-12 DIAGNOSIS — E782 Mixed hyperlipidemia: Secondary | ICD-10-CM | POA: Diagnosis not present

## 2017-10-12 DIAGNOSIS — E785 Hyperlipidemia, unspecified: Secondary | ICD-10-CM

## 2017-10-15 ENCOUNTER — Other Ambulatory Visit: Payer: Medicare Other

## 2017-12-26 DIAGNOSIS — Z01419 Encounter for gynecological examination (general) (routine) without abnormal findings: Secondary | ICD-10-CM | POA: Diagnosis not present

## 2017-12-26 DIAGNOSIS — N393 Stress incontinence (female) (male): Secondary | ICD-10-CM | POA: Diagnosis not present

## 2017-12-26 DIAGNOSIS — Z124 Encounter for screening for malignant neoplasm of cervix: Secondary | ICD-10-CM | POA: Diagnosis not present

## 2018-03-04 DIAGNOSIS — I10 Essential (primary) hypertension: Secondary | ICD-10-CM | POA: Diagnosis not present

## 2018-03-04 DIAGNOSIS — Z6831 Body mass index (BMI) 31.0-31.9, adult: Secondary | ICD-10-CM | POA: Diagnosis not present

## 2018-03-04 DIAGNOSIS — M75101 Unspecified rotator cuff tear or rupture of right shoulder, not specified as traumatic: Secondary | ICD-10-CM | POA: Diagnosis not present

## 2018-03-19 ENCOUNTER — Ambulatory Visit (INDEPENDENT_AMBULATORY_CARE_PROVIDER_SITE_OTHER): Payer: Self-pay | Admitting: Orthopaedic Surgery

## 2018-05-27 DIAGNOSIS — Z1231 Encounter for screening mammogram for malignant neoplasm of breast: Secondary | ICD-10-CM | POA: Diagnosis not present

## 2018-06-05 DIAGNOSIS — Z23 Encounter for immunization: Secondary | ICD-10-CM | POA: Diagnosis not present

## 2018-06-05 DIAGNOSIS — Z6830 Body mass index (BMI) 30.0-30.9, adult: Secondary | ICD-10-CM | POA: Diagnosis not present

## 2018-06-05 DIAGNOSIS — M5416 Radiculopathy, lumbar region: Secondary | ICD-10-CM | POA: Diagnosis not present

## 2018-08-05 DIAGNOSIS — E7849 Other hyperlipidemia: Secondary | ICD-10-CM | POA: Diagnosis not present

## 2018-08-05 DIAGNOSIS — R82998 Other abnormal findings in urine: Secondary | ICD-10-CM | POA: Diagnosis not present

## 2018-08-05 DIAGNOSIS — I10 Essential (primary) hypertension: Secondary | ICD-10-CM | POA: Diagnosis not present

## 2018-08-05 DIAGNOSIS — R7301 Impaired fasting glucose: Secondary | ICD-10-CM | POA: Diagnosis not present

## 2018-08-12 DIAGNOSIS — E669 Obesity, unspecified: Secondary | ICD-10-CM | POA: Diagnosis not present

## 2018-08-12 DIAGNOSIS — Z0000 Encounter for general adult medical examination without abnormal findings: Secondary | ICD-10-CM | POA: Diagnosis not present

## 2018-08-12 DIAGNOSIS — R7301 Impaired fasting glucose: Secondary | ICD-10-CM | POA: Diagnosis not present

## 2018-08-12 DIAGNOSIS — I10 Essential (primary) hypertension: Secondary | ICD-10-CM | POA: Diagnosis not present

## 2018-08-12 DIAGNOSIS — Z23 Encounter for immunization: Secondary | ICD-10-CM | POA: Diagnosis not present

## 2018-08-12 DIAGNOSIS — E7849 Other hyperlipidemia: Secondary | ICD-10-CM | POA: Diagnosis not present

## 2018-08-15 DIAGNOSIS — Z1212 Encounter for screening for malignant neoplasm of rectum: Secondary | ICD-10-CM | POA: Diagnosis not present

## 2019-06-26 DIAGNOSIS — Z23 Encounter for immunization: Secondary | ICD-10-CM | POA: Diagnosis not present

## 2019-07-01 DIAGNOSIS — Z803 Family history of malignant neoplasm of breast: Secondary | ICD-10-CM | POA: Diagnosis not present

## 2019-07-01 DIAGNOSIS — Z1231 Encounter for screening mammogram for malignant neoplasm of breast: Secondary | ICD-10-CM | POA: Diagnosis not present

## 2019-08-18 DIAGNOSIS — R7301 Impaired fasting glucose: Secondary | ICD-10-CM | POA: Diagnosis not present

## 2019-08-18 DIAGNOSIS — E7849 Other hyperlipidemia: Secondary | ICD-10-CM | POA: Diagnosis not present

## 2019-11-29 DIAGNOSIS — U071 COVID-19: Secondary | ICD-10-CM

## 2019-11-29 HISTORY — DX: COVID-19: U07.1

## 2020-01-05 NOTE — Progress Notes (Signed)
Cardiology Consult Note    Date:  01/06/2020   ID:  Belinda Morgan, DOB [birth year], MRN [id number]  PCP:  Marton Redwood, MD  Cardiologist:  Fransico Him, MD   Chief Complaint  Patient presents with  . Chest Pain    History of Present Illness:  Belinda Morgan is a 69 y.o. female who is being seen today for the evaluation of chest pain at the request of Marton Redwood, MD.  This is a 69yo female with a hx of GERD, HLD, MVP and HTN who is referred for evaluation of chest pain.  She tells me that she has been having problems with exertional fatigue for some time. She says that she will be fine one day and then the next day she will do normal exertional activity and can barely exert herself due to severe fatigue.  She says that she does not have a problem during the exertion but then she will be exhausted for the next 3 days.  She tested positive for COVID 19 about 5 weeks ago which she got from her granddaughter and has just had her 2nd COVID vaccine.  She did not get sick from it.    Recently she has started having chest heaviness that she thinks is related to stress as that is what seems to make it come on.  This has been intermittent for 3 weeks and is nonexertional with no radiation or associated sx of Nausea or diaphoresis. She denies any SOB, PND, orthopnea, LE edema, dizziness, palpitations or syncope. She quit smoking 20 years ago.  Her brother had CAD dx in his late 65's.  Her mother died of lung CA and she was estranged from her father.  She also tells me that she has been told that she snores in her sleep and may have sleep apnea.    Past Medical History:  Diagnosis Date  . Anemia    Hx of   . Arthritis   . GERD (gastroesophageal reflux disease)   . Heart murmur   . Hyperlipemia   . Hypertension   . MVP (mitral valve prolapse)    hx of    Past Surgical History:  Procedure Laterality Date  . APPENDECTOMY    . laporoscopy     diagnostic    Current  Medications: Current Meds  Medication Sig  . AmLODIPine Besylate (NORVASC PO) Take 5 mg by mouth daily.   . traMADol (ULTRAM) 50 MG tablet Take 50 mg by mouth every 8 (eight) hours as needed.    . triamterene-hydrochlorothiazide (DYAZIDE) 37.5-25 MG capsule Take 1 capsule by mouth daily.   Current Facility-Administered Medications for the 01/06/20 encounter (Office Visit) with Sueanne Margarita, MD  Medication  . 0.9 %  sodium chloride infusion    Allergies:   Aspirin and E-mycin [erythromycin]   Social History   Socioeconomic History  . Marital status: Married    Spouse name: Not on file  . Number of children: 3  . Years of education: Not on file  . Highest education level: Not on file  Occupational History  . Occupation: Holiday representative  Tobacco Use  . Smoking status: Former Smoker    Quit date: 09/07/1991    Years since quitting: 28.3  . Smokeless tobacco: Never Used  Substance and Sexual Activity  . Alcohol use: No  . Drug use: No  . Sexual activity: Not Currently  Other Topics Concern  . Not on file  Social History Narrative  3 caffeine drinks daily   Social Determinants of Health   Financial Resource Strain:   . Difficulty of Paying Living Expenses:   Food Insecurity:   . Worried About Charity fundraiser in the Last Year:   . Arboriculturist in the Last Year:   Transportation Needs:   . Film/video editor (Medical):   Marland Kitchen Lack of Transportation (Non-Medical):   Physical Activity:   . Days of Exercise per Week:   . Minutes of Exercise per Session:   Stress:   . Feeling of Stress :   Social Connections:   . Frequency of Communication with Friends and Family:   . Frequency of Social Gatherings with Friends and Family:   . Attends Religious Services:   . Active Member of Clubs or Organizations:   . Attends Archivist Meetings:   Marland Kitchen Marital Status:      Family History:  The patient's family history includes Cancer in her mother; Colon  polyps in her brother; Diabetes in her brother.   ROS:   Please see the history of present illness.    ROS All other systems reviewed and are negative.  No flowsheet data found.  PHYSICAL EXAM:   VS:  BP 110/74   Pulse 82   Ht 5\' 5"  (1.651 m)   Wt 184 lb (83.5 kg)   SpO2 96%   BMI 30.62 kg/m    GEN: Well nourished, well developed, in no acute distress  HEENT: normal  Neck: no JVD, carotid bruits, or masses Cardiac: RRR; no murmurs, rubs, or gallops,no edema.  Intact distal pulses bilaterally.  Respiratory:  clear to auscultation bilaterally, normal work of breathing GI: soft, nontender, nondistended, + BS MS: no deformity or atrophy  Skin: warm and dry, no rash Neuro:  Alert and Oriented x 3, Strength and sensation are intact Psych: euthymic mood, full affect  Wt Readings from Last 3 Encounters:  01/06/20 184 lb (83.5 kg)  09/18/17 186 lb (84.4 kg)  08/28/17 186 lb (84.4 kg)      Studies/Labs Reviewed:   EKG:  EKG is ordered today.  The ekg ordered today demonstrates NSR with nonspecific ST abnormality  Recent Labs: No results found for requested labs within last 8760 hours.   Lipid Panel No results found for: CHOL, TRIG, HDL, CHOLHDL, VLDL, LDLCALC, LDLDIRECT  Additional studies/ records that were reviewed today include:  EKG, office notes from PCP    ASSESSMENT:    1. Chest pain of uncertain etiology   2. Essential hypertension   3. Pure hypercholesterolemia   5. Excessive daytime sleepiness      PLAN:  In order of problems listed above:  1. Chest pain -her symptoms are concerning with exertional fatigue and chest pressure -she has a remote hx of tobacco use and fm hx of premature CAD -will get a coronary CTA to assess Calcium score and define coronary anatomy -check 2D echo given recent COVID 19 to make sure LVF is normal  2.  HTN -BP controlled on exam -continue amlodipine 5mg  daily and Dyazide 37.5-25mg  daily  3.  HLD -followed by  PCP -controlled on diet  4.  Excessive daytime sleepiness -she has ben having issues with certain days feeling completely exhausted throughout the day that she cannot physically do anything and then other days she is fine -she has been told that she snores -? Whether sx my be related to OSA -will get a home sleep study    Medication  Adjustments/Labs and Tests Ordered: Current medicines are reviewed at length with the patient today.  Concerns regarding medicines are outlined above.  Medication changes, Labs and Tests ordered today are listed in the Patient Instructions below.  Patient Instructions  Medication Instructions:  Your physician recommends that you continue on your current medications as directed. Please refer to the Current Medication list given to you today.  *If you need a refill on your cardiac medications before your next appointment, please call your pharmacy*   Testing/Procedures: Your physician has requested that you have an echocardiogram. Echocardiography is a painless test that uses sound waves to create images of your heart. It provides your doctor with information about the size and shape of your heart and how well your heart's chambers and valves are working. This procedure takes approximately one hour. There are no restrictions for this procedure.  Your physician has recommended that you have a sleep study. This test records several body functions during sleep, including: brain activity, eye movement, oxygen and carbon dioxide blood levels, heart rate and rhythm, breathing rate and rhythm, the flow of air through your mouth and nose, snoring, body muscle movements, and chest and belly movement.  Follow-Up: At [facility], you and your health needs are our priority.  As part of our continuing mission to provide you with exceptional heart care, we have created designated Provider Care Teams.  These Care Teams include your primary Cardiologist (physician) and  Advanced Practice Providers (APPs -  Physician Assistants and Nurse Practitioners) who all work together to provide you with the care you need, when you need it.  We recommend signing up for the patient portal called "MyChart".  Sign up information is provided on this After Visit Summary.  MyChart is used to connect with patients for Virtual Visits (Telemedicine).  Patients are able to view lab/test results, encounter notes, upcoming appointments, etc.  Non-urgent messages can be sent to your provider as well.   To learn more about what you can do with MyChart, go to [email or web address].    Follow up with Dr. Radford Pax as needed based on results of testing.  Other Instructions Your cardiac CT will be scheduled at one of the below locations:   [facility] [address] [place], [place] [address] [phone]  [place] [address] [place], [place] [address] [phone]  If scheduled at [facility], please arrive at the [facility] main entrance of [facility] 30 minutes prior to test start time. Proceed to the [facility] Radiology Department (first floor) to check-in and test prep.  If scheduled at [facility], please arrive 15 mins early for check-in and test prep.  Please follow these instructions carefully (unless otherwise directed):   On the Night Before the Test: . Be sure to Drink plenty of water. . Do not consume any caffeinated/decaffeinated beverages or chocolate 12 hours prior to your test. . Do not take any antihistamines 12 hours prior to your test.  On the Day of the Test: . Drink plenty of water. Do not drink any water within one hour of the test. . Do not eat any food 4 hours prior to the test. . You may take your regular medications prior to the test.  . Take metoprolol (Lopressor) two hours prior to test. . FEMALES- please wear underwire-free bra if  available      After the Test: . Drink plenty of water. Marland Kitchen  After receiving IV contrast, you may experience a mild flushed feeling. This is normal. . On occasion, you may experience a mild rash up to 24 hours after the test. This is not dangerous. If this occurs, you can take Benadryl 25 mg and increase your fluid intake. . If you experience trouble breathing, this can be serious. If it is severe call 911 IMMEDIATELY. If it is mild, please call our office.   Once we have confirmed authorization from your insurance company, we will call you to set up a date and time for your test.   For non-scheduling related questions, please contact the cardiac imaging nurse navigator should you have any questions/concerns: Marchia Bond, RN Navigator Cardiac Imaging [facility] [phone] office  For scheduling needs, including cancellations and rescheduling, please call [phone].        Signed, Fransico Him, MD  01/06/2020 12:04 PM    [address], [place], [place]  [address] Phone: [phone]; Fax: [phone]

## 2020-01-06 ENCOUNTER — Ambulatory Visit (INDEPENDENT_AMBULATORY_CARE_PROVIDER_SITE_OTHER): Payer: Medicare PPO | Admitting: Cardiology

## 2020-01-06 ENCOUNTER — Telehealth: Payer: Self-pay | Admitting: *Deleted

## 2020-01-06 ENCOUNTER — Encounter: Payer: Self-pay | Admitting: Cardiology

## 2020-01-06 ENCOUNTER — Other Ambulatory Visit: Payer: Self-pay

## 2020-01-06 VITALS — BP 110/74 | HR 82 | Ht 65.0 in | Wt 184.0 lb

## 2020-01-06 DIAGNOSIS — R079 Chest pain, unspecified: Secondary | ICD-10-CM

## 2020-01-06 DIAGNOSIS — I10 Essential (primary) hypertension: Secondary | ICD-10-CM

## 2020-01-06 DIAGNOSIS — E7800 Pure hypercholesterolemia, unspecified: Secondary | ICD-10-CM

## 2020-01-06 DIAGNOSIS — R072 Precordial pain: Secondary | ICD-10-CM | POA: Diagnosis not present

## 2020-01-06 DIAGNOSIS — G4719 Other hypersomnia: Secondary | ICD-10-CM

## 2020-01-06 MED ORDER — METOPROLOL TARTRATE 100 MG PO TABS: 100.0000 mg | ORAL_TABLET | Freq: Once | ORAL | 0 refills | Status: DC

## 2020-01-06 NOTE — Patient Instructions (Addendum)
Medication Instructions:  Your physician recommends that you continue on your current medications as directed. Please refer to the Current Medication list given to you today.  *If you need a refill on your cardiac medications before your next appointment, please call your pharmacy*   Testing/Procedures: Your physician has requested that you have an echocardiogram. Echocardiography is a painless test that uses sound waves to create images of your heart. It provides your doctor with information about the size and shape of your heart and how well your heart's chambers and valves are working. This procedure takes approximately one hour. There are no restrictions for this procedure.  Your physician has recommended that you have a sleep study. This test records several body functions during sleep, including: brain activity, eye movement, oxygen and carbon dioxide blood levels, heart rate and rhythm, breathing rate and rhythm, the flow of air through your mouth and nose, snoring, body muscle movements, and chest and belly movement.  Follow-Up: At [facility], you and your health needs are our priority.  As part of our continuing mission to provide you with exceptional heart care, we have created designated Provider Care Teams.  These Care Teams include your primary Cardiologist (physician) and Advanced Practice Providers (APPs -  Physician Assistants and Nurse Practitioners) who all work together to provide you with the care you need, when you need it.  We recommend signing up for the patient portal called "MyChart".  Sign up information is provided on this After Visit Summary.  MyChart is used to connect with patients for Virtual Visits (Telemedicine).  Patients are able to view lab/test results, encounter notes, upcoming appointments, etc.  Non-urgent messages can be sent to your provider as well.   To learn more about what you can do with MyChart, go to [email or web address].    Follow up with Dr.  Radford Pax as needed based on results of testing.  Other Instructions Your cardiac CT will be scheduled at one of the below locations:   [facility] [address] [place], [place] [address] [phone]  [place] [address] [place], [place] [address] [phone]  If scheduled at [facility], please arrive at the [facility] main entrance of [facility] 30 minutes prior to test start time. Proceed to the [facility] Radiology Department (first floor) to check-in and test prep.  If scheduled at [facility], please arrive 15 mins early for check-in and test prep.  Please follow these instructions carefully (unless otherwise directed):   On the Night Before the Test: . Be sure to Drink plenty of water. . Do not consume any caffeinated/decaffeinated beverages or chocolate 12 hours prior to your test. . Do not take any antihistamines 12 hours prior to your test.  On the Day of the Test: . Drink plenty of water. Do not drink any water within one hour of the test. . Do not eat any food 4 hours prior to the test. . You may take your regular medications prior to the test.  . Take metoprolol (Lopressor) two hours prior to test. . FEMALES- please wear underwire-free bra if available      After the Test: . Drink plenty of water. . After receiving IV contrast, you may experience a mild flushed feeling. This is normal. . On occasion, you may experience a mild rash up to 24 hours after the test. This is not dangerous. If this occurs, you can take Benadryl  25 mg and increase your fluid intake. . If you experience trouble breathing, this can be serious. If it is severe call 911 IMMEDIATELY. If it is mild, please call our office.   Once we have confirmed authorization from your insurance company, we will call you to set up a date and time for your test.   For non-scheduling related  questions, please contact the cardiac imaging nurse navigator should you have any questions/concerns: Marchia Bond, RN Navigator Cardiac Imaging [facility] [phone] office  For scheduling needs, including cancellations and rescheduling, please call [phone].

## 2020-01-06 NOTE — Telephone Encounter (Signed)
-----   Message from Antonieta Iba, RN sent at 01/06/2020  9:27 AM EDT ----- Home sleep study has been ordered.  Thanks!

## 2020-01-26 ENCOUNTER — Ambulatory Visit (HOSPITAL_COMMUNITY): Payer: Medicare PPO | Attending: Cardiology

## 2020-01-26 ENCOUNTER — Other Ambulatory Visit: Payer: Self-pay

## 2020-01-26 DIAGNOSIS — R079 Chest pain, unspecified: Secondary | ICD-10-CM | POA: Diagnosis present

## 2020-01-26 DIAGNOSIS — R072 Precordial pain: Secondary | ICD-10-CM | POA: Insufficient documentation

## 2020-02-05 ENCOUNTER — Other Ambulatory Visit: Payer: Self-pay

## 2020-02-05 ENCOUNTER — Other Ambulatory Visit: Payer: Medicare PPO

## 2020-02-05 DIAGNOSIS — I10 Essential (primary) hypertension: Secondary | ICD-10-CM

## 2020-02-06 LAB — BASIC METABOLIC PANEL
BUN/Creatinine Ratio: 16 (ref 12–28)
BUN: 15 mg/dL (ref 8–27)
CO2: 26 mmol/L (ref 20–29)
Calcium: 10.2 mg/dL (ref 8.7–10.3)
Chloride: 97 mmol/L (ref 96–106)
Creatinine, Ser: 0.96 mg/dL (ref 0.57–1.00)
GFR calc Af Amer: 71 mL/min/{1.73_m2} (ref >59)
GFR calc non Af Amer: 61 mL/min/{1.73_m2} (ref >59)
Glucose: 143 mg/dL — ABNORMAL HIGH (ref 65–99)
Potassium: 4.1 mmol/L (ref 3.5–5.2)
Sodium: 138 mmol/L (ref 134–144)

## 2020-02-07 ENCOUNTER — Emergency Department (HOSPITAL_COMMUNITY): Payer: Medicare PPO

## 2020-02-07 ENCOUNTER — Encounter (HOSPITAL_COMMUNITY): Payer: Self-pay

## 2020-02-07 ENCOUNTER — Other Ambulatory Visit: Payer: Self-pay

## 2020-02-07 ENCOUNTER — Emergency Department (HOSPITAL_COMMUNITY)
Admission: EM | Admit: 2020-02-07 | Discharge: 2020-02-07 | Disposition: A | Discharge status: Home / Self Care | Payer: Medicare PPO | Attending: Emergency Medicine | Admitting: Emergency Medicine

## 2020-02-07 DIAGNOSIS — Y999 Unspecified external cause status: Secondary | ICD-10-CM | POA: Diagnosis not present

## 2020-02-07 DIAGNOSIS — S9305XA Dislocation of left ankle joint, initial encounter: Secondary | ICD-10-CM

## 2020-02-07 DIAGNOSIS — S82302A Unspecified fracture of lower end of left tibia, initial encounter for closed fracture: Secondary | ICD-10-CM | POA: Diagnosis not present

## 2020-02-07 DIAGNOSIS — W010XXA Fall on same level from slipping, tripping and stumbling without subsequent striking against object, initial encounter: Secondary | ICD-10-CM | POA: Diagnosis not present

## 2020-02-07 DIAGNOSIS — S99911A Unspecified injury of right ankle, initial encounter: Secondary | ICD-10-CM | POA: Diagnosis present

## 2020-02-07 DIAGNOSIS — Y92007 Garden or yard of unspecified non-institutional (private) residence as the place of occurrence of the external cause: Secondary | ICD-10-CM | POA: Diagnosis not present

## 2020-02-07 DIAGNOSIS — S92132A Displaced fracture of posterior process of left talus, initial encounter for closed fracture: Secondary | ICD-10-CM | POA: Diagnosis not present

## 2020-02-07 DIAGNOSIS — S82892A Other fracture of left lower leg, initial encounter for closed fracture: Secondary | ICD-10-CM

## 2020-02-07 DIAGNOSIS — S82832A Other fracture of upper and lower end of left fibula, initial encounter for closed fracture: Secondary | ICD-10-CM | POA: Insufficient documentation

## 2020-02-07 DIAGNOSIS — I10 Essential (primary) hypertension: Secondary | ICD-10-CM | POA: Insufficient documentation

## 2020-02-07 DIAGNOSIS — Z87891 Personal history of nicotine dependence: Secondary | ICD-10-CM | POA: Insufficient documentation

## 2020-02-07 DIAGNOSIS — Y939 Activity, unspecified: Secondary | ICD-10-CM | POA: Diagnosis not present

## 2020-02-07 DIAGNOSIS — Z79899 Other long term (current) drug therapy: Secondary | ICD-10-CM | POA: Diagnosis not present

## 2020-02-07 MED ORDER — IBUPROFEN 600 MG PO TABS: 600.0000 mg | ORAL_TABLET | Freq: Four times a day (QID) | ORAL | 0 refills | Status: DC | PRN

## 2020-02-07 MED ORDER — PROPOFOL 10 MG/ML IV BOLUS
INTRAVENOUS | Status: AC | PRN
  Administered 2020-02-07: 83.5 mg via INTRAVENOUS

## 2020-02-07 MED ORDER — OXYCODONE-ACETAMINOPHEN 5-325 MG PO TABS: 1.0000 | ORAL_TABLET | Freq: Four times a day (QID) | ORAL | 0 refills | Status: AC | PRN

## 2020-02-07 MED ORDER — PROPOFOL 10 MG/ML IV BOLUS
1.0000 mg/kg | Freq: Once | INTRAVENOUS | Status: AC
  Administered 2020-02-07: 83.5 mg via INTRAVENOUS
  Filled 2020-02-07: qty 20

## 2020-02-07 NOTE — Progress Notes (Signed)
CO2 reading during conscious sedation 27-34.

## 2020-02-07 NOTE — Discharge Instructions (Addendum)
You have a fracture of your tibia and fibula (your lower leg and shin bone), just above your ankle.  You also have a dislocation of your left foot below the ankle joint.  We attempted to fix your dislocation in the ER today, but were not able to do so.  The orthopedic surgeon on call felt it was reasonable to discharge you home with a cast and crutches, and close office follow up.  There were no signs of nerve or blood vessel injury to your foot in the ER today.  Please call the orthopedic office number above on Tuesday Morning to confirm your follow appointment THIS WEEK with Dr Delfino Lovett.  Please let them know that you were seen in the ER today, and Dr. Delfino Lovett wanted you to be seen as soon as possible on the office.  Use crutches at all time.  Do not bear any weight on your left foot.  Keep your cast dry at home.  If your toes turn purple or blue, or you lose feeling in your foot, or the pain becomes severe, please return to the ER immediately.  These may be signs of injuries to your nerves or blood vessels in your ankle or foot, which may need immediate medical attention.

## 2020-02-07 NOTE — ED Triage Notes (Signed)
Per patient she tripped over the water hose and "heard a bunch of popping sounds" in her left foot and ankle. Patient reports pain 3/10

## 2020-02-07 NOTE — ED Provider Notes (Signed)
[facility] DEPT Provider Note   CSN: [id number] Arrival date & time: 02/07/20  [id number]     History Chief Complaint  Patient presents with   Fall   Ankle Injury    Belinda Morgan is a 69 y.o. female with pertinent past medical history of anemia, hyperlipidemia, hypertension that presents emergency department today for ankle injury.  Patient states that stepped on a wet hose this morning and fell over, states that she inverted her ankle and had to crawl back into the house.  States that she got her husband who helped her come to the car and back to the emergency department.  States that she sprained ankle before, however is never had any fractures to it.  Patient states that her pain is a 3/10 states that it does not hurt that much.  States that she is not taking anything for the pain.  She does not want anything for the pain.  Is not on any blood thinners. HPI     Past Medical History:  Diagnosis Date   Anemia    Hx of    Arthritis    GERD (gastroesophageal reflux disease)    Heart murmur    Hyperlipemia    Hypertension    MVP (mitral valve prolapse)    hx of    Patient Active Problem List   Diagnosis Date Noted   Blood in urine 08/28/2017   Hemorrhage of rectum and anus 06/21/2011    Past Surgical History:  Procedure Laterality Date   APPENDECTOMY     laporoscopy     diagnostic     OB History   No obstetric history on file.     Family History  Problem Relation Age of Onset   Diabetes Brother        x 3    Cancer Mother    Colon polyps Brother    Colon cancer Neg Hx    Esophageal cancer Neg Hx    Rectal cancer Neg Hx    Stomach cancer Neg Hx     Social History   Tobacco Use   Smoking status: Former Smoker    Quit date: 09/07/1991    Years since quitting: 28.4   Smokeless tobacco: Never Used  Substance Use Topics   Alcohol use: No   Drug use: No    Home Medications Prior to Admission  medications   Medication Sig Start Date End Date Taking? Authorizing Provider  AmLODIPine Besylate (NORVASC PO) Take 5 mg by mouth daily.     [provider]  ibuprofen (ADVIL) 600 MG tablet Take 1 tablet (600 mg total) by mouth every 6 (six) hours as needed for up to 30 doses. 02/07/20   Wyvonnia Dusky, MD  metoprolol tartrate (LOPRESSOR) 100 MG tablet Take 1 tablet (100 mg total) by mouth once for 1 dose. Take 1 tablet (100 mg) two hours prior to CT scan 01/06/20 01/06/20  Sueanne Margarita, MD  oxyCODONE-acetaminophen (PERCOCET/ROXICET) 5-325 MG tablet Take 1 tablet by mouth every 6 (six) hours as needed for up to 3 days for severe pain. 02/07/20 02/10/20  Wyvonnia Dusky, MD  traMADol (ULTRAM) 50 MG tablet Take 50 mg by mouth every 8 (eight) hours as needed.      [provider]  triamterene-hydrochlorothiazide (DYAZIDE) 37.5-25 MG capsule Take 1 capsule by mouth daily. 08/08/17   [provider]    Allergies    Aspirin and E-mycin [erythromycin]  Review of Systems  Review of Systems  Constitutional: Negative for chills, diaphoresis, fatigue and fever.  HENT: Negative for congestion, sore throat and trouble swallowing.   Eyes: Negative for pain and visual disturbance.  Respiratory: Negative for cough, shortness of breath and wheezing.   Cardiovascular: Negative for chest pain, palpitations and leg swelling.  Gastrointestinal: Negative for abdominal distention, abdominal pain, diarrhea, nausea and vomiting.  Genitourinary: Negative for difficulty urinating.  Musculoskeletal: Positive for arthralgias (Left ankle) and joint swelling (Left ankle). Negative for back pain, neck pain and neck stiffness.  Skin: Negative for pallor.  Neurological: Negative for dizziness, speech difficulty, weakness and headaches.  Psychiatric/Behavioral: Negative for confusion.    Physical Exam Updated Vital Signs BP 128/69    Pulse 75    Temp 98.2 F (36.8 C) (Oral)    Resp 15     Ht 5\' 4"  (1.626 m)    Wt 83.5 kg    SpO2 99%    BMI 31.60 kg/m   Physical Exam Constitutional:      General: She is not in acute distress.    Appearance: Normal appearance. She is not ill-appearing, toxic-appearing or diaphoretic.  HENT:     Mouth/Throat:     Mouth: Mucous membranes are moist.     Pharynx: Oropharynx is clear.  Eyes:     General: No scleral icterus.    Extraocular Movements: Extraocular movements intact.     Pupils: Pupils are equal, round, and reactive to light.  Cardiovascular:     Rate and Rhythm: Normal rate and regular rhythm.     Pulses: Normal pulses.     Heart sounds: Normal heart sounds.  Pulmonary:     Effort: Pulmonary effort is normal. No respiratory distress.     Breath sounds: Normal breath sounds. No stridor. No wheezing, rhonchi or rales.  Chest:     Chest wall: No tenderness.  Abdominal:     General: Abdomen is flat. There is no distension.     Palpations: Abdomen is soft.     Tenderness: There is no abdominal tenderness. There is no guarding or rebound.  Musculoskeletal:        General: No swelling.     Cervical back: Normal range of motion and neck supple. No rigidity.     Right lower leg: No edema.     Left lower leg: No edema.     Left ankle: Swelling present. Tenderness present. Decreased range of motion.     Comments: Left ankle with moderate amount of swelling, patient is able to move ankle and leg in all directions minimally.  Normal strength and sensation of toes.  Normal sensation of legs.  DP and PT pulses equal bilaterally.  Did use Doppler for this.  No overlying skin changes, now ecchymosis noted.  Patient is minimally tender over ankle and foot.  Skin:    General: Skin is warm and dry.     Capillary Refill: Capillary refill takes less than 2 seconds.     Coloration: Skin is not pale.  Neurological:     General: No focal deficit present.     Mental Status: She is alert and oriented to person, place, and time.  Psychiatric:          Mood and Affect: Mood normal.        Behavior: Behavior normal.     ED Results / Procedures / Treatments   Labs (all labs ordered are listed, but only abnormal results are displayed) Labs Reviewed - No data to  display  EKG None  Radiology DG Ankle 2 Views Left  Result Date: 02/07/2020 CLINICAL DATA:  Status post reduction EXAM: LEFT ANKLE - 2 VIEW COMPARISON:  Films from earlier in the same day. FINDINGS: Distal tibial and fibular fractures are again noted and stable. Casting material is seen. IMPRESSION: Stable distal tibial and fibular fractures. Electronically Signed   By: Inez Catalina M.D.   On: 02/07/2020 14:52   DG Ankle 2 Views Left  Result Date: 02/07/2020 CLINICAL DATA:  Post reduction for fracture seen EXAM: LEFT ANKLE - 2 VIEW COMPARISON:  Pre reduction left ankle radiographs Feb 07, 2020 FINDINGS: Oblique and lateral views obtained. A true frontal image not obtained. Immobilization device present. There is a fracture of the medial aspect of the distal tibia which extends into the medial tibial plafond. This fracture shows slight increase in displacement of fracture fragments compared to pre reduction. Fracture of of the distal fibular diaphysis currently is in near anatomic alignment. There remains mild posterior displacement of the hindfoot with respect to the tibial plafond. There is a fracture of the posterior tibia, essentially stable. IMPRESSION: Persistent posterior displacement of the hindfoot with respect to the ankle mortise. Fractures of the posterior tibia and distal fibula in near anatomic alignment. A fracture of the medial aspect of the distal tibia with a fracture fragment extending into the medial tibial plafond appears slightly more displaced than on study obtained earlier in the day. Electronically Signed   By: Lowella Grip III M.D.   On: 02/07/2020 14:09   DG Ankle Complete Left  Result Date: 02/07/2020 CLINICAL DATA:  Recent fall with ankle pain,  initial encounter EXAM: LEFT ANKLE COMPLETE - 3+ VIEW COMPARISON:  None. FINDINGS: There are changes consistent oblique fracture through the distal fibula. Posterior malleolar fracture is noted as well. Some mild posterior displacement of the talus with respect to the distal tibia is seen. Generalized soft tissue swelling is noted. IMPRESSION: Fractures of the distal fibula and posterior malleolus with posterior displacement of the talus. Electronically Signed   By: Inez Catalina M.D.   On: 02/07/2020 10:31   CT Ankle Left Wo Contrast  Result Date: 02/07/2020 CLINICAL DATA:  Fractures of the distal tibia and fibula. EXAM: CT OF THE LEFT ANKLE WITHOUT CONTRAST TECHNIQUE: Multidetector CT imaging of the left ankle was performed according to the standard protocol. Multiplanar CT image reconstructions were also generated. COMPARISON:  Radiographs dated 02/07/2020 FINDINGS: Bones/Joint/Cartilage There is a comminuted fracture of the distal tibia involving the base of the medial malleolus and the posterior malleolus. Posterior malleolar component involves approximately 23% of the AP dimension of the articular surface. The fracture through the base of the medial malleolus has oblique and sagittal components with slight impaction of the articular surface. The foot is slightly subluxed posteriorly with respect to the distal tibia. There is abnormal widening of the anterior and lateral ankle joint space. There is a slightly displaced and distracted spiral fracture of the distal fibular shaft. There is disruption of the distal tibiofibular syndesmosis best appreciated on the axial images, series 12. There are cystic degenerative changes of the medial aspect of the dome of the talus. Ligaments Suboptimally assessed by CT. Ligaments of the ankle are not well enough seen for assessment. Muscles and Tendons No discrete abnormality of the muscles or tendons at the ankle. Soft tissues Soft tissue edema or bruising primarily  anteriorly and laterally at the ankle. IMPRESSION: 1. Comminuted fracture of the distal tibia as described.  2. Fracture of the distal fibular shaft. 3. Widening of the anterior and lateral ankle joint space. 4. Widening of the distal tibiofibular syndesmosis. Electronically Signed   By: Lorriane Shire M.D.   On: 02/07/2020 15:45   DG Foot Complete Left  Result Date: 02/07/2020 CLINICAL DATA:  Recent fall with ankle pain and swelling, initial encounter EXAM: LEFT FOOT - COMPLETE 3+ VIEW COMPARISON:  None. FINDINGS: Changes consistent with the distal tibial and fibular fractures are again seen. Posterior displacement of the talus with respect to the distal tibia is noted. Soft tissue swelling is noted. IMPRESSION: Distal tibial and fibular fractures with posterior displacement of the talus with respect to the distal tibia. Electronically Signed   By: Inez Catalina M.D.   On: 02/07/2020 10:32    Procedures Procedures (including critical care time)  Medications Ordered in ED Medications  propofol (DIPRIVAN) 10 mg/mL bolus/IV push 83.5 mg (83.5 mg Intravenous Given 02/07/20 1346)  propofol (DIPRIVAN) 10 mg/mL bolus/IV push (83.5 mg Intravenous Given 02/07/20 1346)    ED Course  I have reviewed the triage vital signs and the nursing notes.  Pertinent labs & imaging results that were available during my care of the patient were reviewed by me and considered in my medical decision making (see chart for details).  Clinical Course as of Feb 06 1934  Sat Feb 07, 2020  1306 Patient consented for propofol sedation and ankle reduction.  Alternatives to CS discussed, she would prefer sedation over IV opioid medications or attempted hematoma block.  Last meal last night.  No hx of complications with anesthesia or pulmonary problems.     [MT]  1507 Patient has fully recovered from anesthesia.  Per PA discussion with Dr Delfino Lovett, we'll obtain a CT ankle, make patient NWB, and she will f/u with them in the clinic  this week.  We can offer a short supply of percocet for breakthrough pain at home, advised NSAIDS as well.   [MT]    Clinical Course User Index [MT] Trifan, Carola Rhine, MD   MDM Rules/Calculators/A&P                     LATRISE BURROUS is a 69 y.o. female with pertinent past medical history of anemia, hyperlipidemia, hypertension that presents emergency department today for ankle injury.   Physical exam with ankle edema, no significant tenderness on exam.  Patient is able to move ankle in all directions minimally.  Neurovascular distally intact.  Normal sensation in toes and pulses bilateral and equal on feet, did use Doppler for this.  X-ray with distal tibial and fibular fractures with posterior displacement of the talus.  Will consult orthopedics for recs.  Consulted Dr. Lyla Glassing who recommended reduction and splint with postreduction films.  Reduction was done with sedation with Dr. Langston Masker at 1346, patient handled procedure well and was splinted.  Postreduction films were obtained, with successful reduction and consulted Dr. Lyla Glassing again who recommended CT of ankle and follow-up with Ortho with outpatient this week.   CT as outlined above, patient discharged home with Percocet and ibuprofen.  Discharge home with RICE precautions.  Patient will follow up with With Ortho on Tuesday. Pt is splinted and given crutches. Expressed importance to not bear weight on leg. Explained that pt should not take any other pain medications when taking Percocet. Pt is agreeable and ready for discharge.  Doubt need for further emergent work up at this time. I explained the diagnosis and have  given explicit precautions to return to the ER including for any other new or worsening symptoms. The patient understands and accepts the medical plan as it's been dictated and I have answered their questions. Discharge instructions concerning home care and prescriptions have been given. The patient is STABLE and is  discharged to home in good condition.  .I discussed this case with my attending physician who cosigned this note including patient's presenting symptoms, physical exam, and planned diagnostics and interventions. Attending physician stated agreement with plan or made changes to plan which were implemented.   Attending physician assessed patient at bedside.  Final Clinical Impression(s) / ED Diagnoses Final diagnoses:  Closed fracture of left ankle, initial encounter  Closed dislocation of left talus, initial encounter    Rx / DC Orders ED Discharge Orders         Ordered    oxyCODONE-acetaminophen (PERCOCET/ROXICET) 5-325 MG tablet  Every 6 hours PRN     02/07/20 1540    ibuprofen (ADVIL) 600 MG tablet  Every 6 hours PRN     02/07/20 1540           Alfredia Client, PA-C 02/07/20 2058    Wyvonnia Dusky, MD 02/08/20 1331

## 2020-02-08 NOTE — ED Provider Notes (Signed)
.Sedation  Date/Time: 02/08/2020 1:31 PM Performed by: Wyvonnia Dusky, MD Authorized by: Wyvonnia Dusky, MD   Consent:    Consent obtained:  Verbal and written   Consent given by:  Patient   Risks discussed:  Allergic reaction, dysrhythmia, inadequate sedation, nausea, prolonged hypoxia resulting in organ damage, prolonged sedation necessitating reversal, respiratory compromise necessitating ventilatory assistance and intubation and vomiting   Alternatives discussed:  Analgesia without sedation, anxiolysis and regional anesthesia Universal protocol:    Procedure explained and questions answered to patient or proxy's satisfaction: yes     Relevant documents present and verified: yes     Test results available and properly labeled: yes     Imaging studies available: yes     Required blood products, implants, devices, and special equipment available: yes     Site/side marked: yes     Immediately prior to procedure a time out was called: yes     Patient identity confirmation method:  Verbally with patient and arm band Indications:    Procedure performed:  Fracture reduction   Procedure necessitating sedation performed by:  Physician performing sedation Pre-sedation assessment:    Time since last food or drink:  Over 6 hours   ASA classification: class 2 - patient with mild systemic disease     Neck mobility: normal     Mouth opening:  2 finger widths   Thyromental distance:  3 finger widths   Mallampati score:  II - soft palate, uvula, fauces visible   Pre-sedation assessments completed and reviewed: airway patency, cardiovascular function, hydration status, mental status, nausea/vomiting, pain level, respiratory function and temperature   Immediate pre-procedure details:    Reassessment: Patient reassessed immediately prior to procedure     Reviewed: vital signs, relevant labs/tests and NPO status     Verified: bag valve mask available, emergency equipment available, intubation  equipment available, IV patency confirmed, oxygen available and suction available   Procedure details (see MAR for exact dosages):    Preoxygenation:  Nasal cannula   Sedation:  Propofol   Intended level of sedation: deep   Intra-procedure monitoring:  Blood pressure monitoring, cardiac monitor, continuous pulse oximetry, frequent LOC assessments, frequent vital sign checks and continuous capnometry   Intra-procedure events: none     Total Provider sedation time (minutes):  30 Post-procedure details:    Attendance: Constant attendance by certified staff until patient recovered     Recovery: Patient returned to pre-procedure baseline     Post-sedation assessments completed and reviewed: airway patency, cardiovascular function, hydration status, mental status, nausea/vomiting, pain level, respiratory function and temperature     Patient is stable for discharge or admission: yes     Patient tolerance:  Tolerated well, no immediate complications .Ortho Injury Treatment  Date/Time: 02/08/2020 1:33 PM Performed by: Wyvonnia Dusky, MD Authorized by: Wyvonnia Dusky, MD   Consent:    Consent obtained:  Written   Consent given by:  Patient   Risks discussed:  Irreducible dislocation, fracture and nerve damage   Alternatives discussed:  Immobilization and referralInjury location: foot Location details: left foot Injury type: dislocation Dislocation type: talotarsal Pre-procedure neurovascular assessment: neurovascularly intact Pre-procedure distal perfusion: normal Pre-procedure neurological function: normal Pre-procedure range of motion: reduced  Anesthesia: Local anesthesia used: no  Patient sedated: Yes. Refer to sedation procedure documentation for details of sedation. Manipulation performed: yes Reduction method: traction and counter traction Reduction successful: no Immobilization: splint Splint type: short leg Supplies used: Ortho-Glass Post-procedure neurovascular  assessment:  post-procedure neurovascularly intact Post-procedure distal perfusion: normal Post-procedure neurological function: normal Post-procedure range of motion: unchanged Patient tolerance: patient tolerated the procedure well with no immediate complications      Wyvonnia Dusky, MD 02/08/20 1334

## 2020-02-10 ENCOUNTER — Telehealth (HOSPITAL_COMMUNITY): Payer: Self-pay | Admitting: *Deleted

## 2020-02-10 ENCOUNTER — Ambulatory Visit (HOSPITAL_COMMUNITY): Payer: Medicare PPO

## 2020-02-10 NOTE — Telephone Encounter (Signed)
Called pt in order to go over cardiac CT scan instructions.  Pt states she had a fall over the weekend and will not be able to make it to her scan in the morning.  Pt informed that the scheduling team will be made aware and reach out to re-schedule.  Pt expressed understanding.  Burley Saver, Cardiac Imaging Nurse Navigator Office: [phone]

## 2020-02-11 ENCOUNTER — Ambulatory Visit (HOSPITAL_COMMUNITY): Admission: RE | Admit: 2020-02-11 | Payer: Medicare PPO | Source: Ambulatory Visit

## 2020-02-13 ENCOUNTER — Other Ambulatory Visit: Payer: Self-pay

## 2020-02-13 ENCOUNTER — Encounter (HOSPITAL_BASED_OUTPATIENT_CLINIC_OR_DEPARTMENT_OTHER): Payer: Self-pay | Admitting: Orthopedic Surgery

## 2020-02-13 ENCOUNTER — Telehealth: Payer: Self-pay | Admitting: *Deleted

## 2020-02-13 ENCOUNTER — Other Ambulatory Visit (HOSPITAL_COMMUNITY): Payer: Self-pay | Admitting: Orthopedic Surgery

## 2020-02-13 NOTE — Progress Notes (Signed)
Reviewed with Dr Kalman Shan. Ok to proceed as planned

## 2020-02-13 NOTE — Telephone Encounter (Signed)
Staff message sent to Gae Bon ok to schedule HST no PA is required by [facility].

## 2020-02-16 ENCOUNTER — Encounter (HOSPITAL_BASED_OUTPATIENT_CLINIC_OR_DEPARTMENT_OTHER)
Admission: RE | Admit: 2020-02-16 | Discharge: 2020-02-16 | Disposition: A | Discharge status: Home / Self Care | Payer: Medicare PPO | Source: Ambulatory Visit | Attending: Orthopedic Surgery | Admitting: Orthopedic Surgery

## 2020-02-16 ENCOUNTER — Other Ambulatory Visit (HOSPITAL_COMMUNITY)
Admission: RE | Admit: 2020-02-16 | Discharge: 2020-02-16 | Disposition: A | Discharge status: Home / Self Care | Payer: Medicare PPO | Source: Ambulatory Visit | Attending: Orthopedic Surgery | Admitting: Orthopedic Surgery

## 2020-02-16 DIAGNOSIS — M1990 Unspecified osteoarthritis, unspecified site: Secondary | ICD-10-CM | POA: Diagnosis not present

## 2020-02-16 DIAGNOSIS — Z01812 Encounter for preprocedural laboratory examination: Secondary | ICD-10-CM | POA: Insufficient documentation

## 2020-02-16 DIAGNOSIS — Z881 Allergy status to other antibiotic agents status: Secondary | ICD-10-CM | POA: Diagnosis not present

## 2020-02-16 DIAGNOSIS — I10 Essential (primary) hypertension: Secondary | ICD-10-CM | POA: Diagnosis not present

## 2020-02-16 DIAGNOSIS — Z8616 Personal history of COVID-19: Secondary | ICD-10-CM | POA: Diagnosis not present

## 2020-02-16 DIAGNOSIS — Z20822 Contact with and (suspected) exposure to covid-19: Secondary | ICD-10-CM | POA: Insufficient documentation

## 2020-02-16 DIAGNOSIS — Z79899 Other long term (current) drug therapy: Secondary | ICD-10-CM | POA: Diagnosis not present

## 2020-02-16 DIAGNOSIS — X58XXXA Exposure to other specified factors, initial encounter: Secondary | ICD-10-CM | POA: Diagnosis not present

## 2020-02-16 DIAGNOSIS — S82852A Displaced trimalleolar fracture of left lower leg, initial encounter for closed fracture: Secondary | ICD-10-CM | POA: Diagnosis present

## 2020-02-16 DIAGNOSIS — Z87891 Personal history of nicotine dependence: Secondary | ICD-10-CM | POA: Diagnosis not present

## 2020-02-16 DIAGNOSIS — Z886 Allergy status to analgesic agent status: Secondary | ICD-10-CM | POA: Diagnosis not present

## 2020-02-16 LAB — BASIC METABOLIC PANEL
Anion gap: 14 (ref 5–15)
BUN: 20 mg/dL (ref 8–23)
CO2: 24 mmol/L (ref 22–32)
Calcium: 9.5 mg/dL (ref 8.9–10.3)
Chloride: 102 mmol/L (ref 98–111)
Creatinine, Ser: 0.74 mg/dL (ref 0.44–1.00)
GFR calc Af Amer: >60 mL/min (ref >60)
GFR calc non Af Amer: >60 mL/min (ref >60)
Glucose, Bld: 110 mg/dL — ABNORMAL HIGH (ref 70–99)
Potassium: 4.1 mmol/L (ref 3.5–5.1)
Sodium: 140 mmol/L (ref 135–145)

## 2020-02-16 LAB — SARS CORONAVIRUS 2 (TAT 6-24 HRS): SARS Coronavirus 2: NEGATIVE

## 2020-02-18 NOTE — Anesthesia Preprocedure Evaluation (Addendum)
Anesthesia Evaluation  Patient identified by MRN, date of birth, ID band Patient awake    Reviewed: Allergy & Precautions, NPO status , Patient's Chart, lab work & pertinent test results  History of Anesthesia Complications Negative for: history of anesthetic complications  Airway Mallampati: II  TM Distance: >3 FB Neck ROM: Full    Dental  (+) Edentulous Upper   Pulmonary former smoker,    Pulmonary exam normal        Cardiovascular hypertension, Pt. on medications Normal cardiovascular exam+ Valvular Problems/Murmurs MVP      Neuro/Psych negative neurological ROS  negative psych ROS   GI/Hepatic Neg liver ROS, GERD  Controlled,  Endo/Other  negative endocrine ROS  Renal/GU negative Renal ROS  negative genitourinary   Musculoskeletal Left ankle trimalleolar fracture   Abdominal   Peds  Hematology negative hematology ROS (+)   Anesthesia Other Findings Day of surgery medications reviewed with patient.  Reproductive/Obstetrics negative OB ROS                            Anesthesia Physical Anesthesia Plan  ASA: II  Anesthesia Plan: General   Post-op Pain Management: GA combined w/ Regional for post-op pain   Induction: Intravenous  PONV Risk Score and Plan: 3 and Treatment may vary due to age or medical condition, Ondansetron, Dexamethasone and Midazolam  Airway Management Planned: LMA  Additional Equipment: None  Intra-op Plan:   Post-operative Plan: Extubation in OR  Informed Consent: I have reviewed the patients History and Physical, chart, labs and discussed the procedure including the risks, benefits and alternatives for the proposed anesthesia with the patient or authorized representative who has indicated his/her understanding and acceptance.     Dental advisory given  Plan Discussed with: CRNA  Anesthesia Plan Comments:        Anesthesia Quick Evaluation

## 2020-02-19 ENCOUNTER — Encounter (HOSPITAL_BASED_OUTPATIENT_CLINIC_OR_DEPARTMENT_OTHER)
Admission: RE | Disposition: A | Discharge status: Home / Self Care | Payer: Self-pay | Source: Home / Self Care | Attending: Orthopedic Surgery

## 2020-02-19 ENCOUNTER — Ambulatory Visit (HOSPITAL_BASED_OUTPATIENT_CLINIC_OR_DEPARTMENT_OTHER): Payer: Medicare PPO | Admitting: Anesthesiology

## 2020-02-19 ENCOUNTER — Other Ambulatory Visit: Payer: Self-pay

## 2020-02-19 ENCOUNTER — Encounter (HOSPITAL_BASED_OUTPATIENT_CLINIC_OR_DEPARTMENT_OTHER): Payer: Self-pay | Admitting: Orthopedic Surgery

## 2020-02-19 ENCOUNTER — Ambulatory Visit (HOSPITAL_BASED_OUTPATIENT_CLINIC_OR_DEPARTMENT_OTHER)
Admission: RE | Admit: 2020-02-19 | Discharge: 2020-02-19 | Disposition: A | Discharge status: Home / Self Care | Payer: Medicare PPO | Attending: Orthopedic Surgery | Admitting: Orthopedic Surgery

## 2020-02-19 DIAGNOSIS — M1990 Unspecified osteoarthritis, unspecified site: Secondary | ICD-10-CM | POA: Insufficient documentation

## 2020-02-19 DIAGNOSIS — Z79899 Other long term (current) drug therapy: Secondary | ICD-10-CM | POA: Insufficient documentation

## 2020-02-19 DIAGNOSIS — Z881 Allergy status to other antibiotic agents status: Secondary | ICD-10-CM | POA: Insufficient documentation

## 2020-02-19 DIAGNOSIS — X58XXXA Exposure to other specified factors, initial encounter: Secondary | ICD-10-CM | POA: Insufficient documentation

## 2020-02-19 DIAGNOSIS — S82852A Displaced trimalleolar fracture of left lower leg, initial encounter for closed fracture: Secondary | ICD-10-CM | POA: Diagnosis not present

## 2020-02-19 DIAGNOSIS — Z8616 Personal history of COVID-19: Secondary | ICD-10-CM | POA: Insufficient documentation

## 2020-02-19 DIAGNOSIS — Z886 Allergy status to analgesic agent status: Secondary | ICD-10-CM | POA: Insufficient documentation

## 2020-02-19 DIAGNOSIS — Z87891 Personal history of nicotine dependence: Secondary | ICD-10-CM | POA: Insufficient documentation

## 2020-02-19 DIAGNOSIS — I10 Essential (primary) hypertension: Secondary | ICD-10-CM | POA: Insufficient documentation

## 2020-02-19 HISTORY — PX: ORIF ANKLE FRACTURE: SHX5408

## 2020-02-19 SURGERY — OPEN REDUCTION INTERNAL FIXATION (ORIF) ANKLE FRACTURE
Anesthesia: General | Site: Ankle | Laterality: Left

## 2020-02-19 MED ORDER — 0.9 % SODIUM CHLORIDE (POUR BTL) OPTIME
TOPICAL | Status: DC | PRN
  Administered 2020-02-19: 200 mL

## 2020-02-19 MED ORDER — OXYCODONE HCL 5 MG PO TABS: 5.0000 mg | ORAL_TABLET | Freq: Once | ORAL | Status: DC | PRN

## 2020-02-19 MED ORDER — BUPIVACAINE-EPINEPHRINE (PF) 0.5% -1:200000 IJ SOLN
INTRAMUSCULAR | Status: DC | PRN
  Administered 2020-02-19: 10 mL via PERINEURAL
  Administered 2020-02-19: 20 mL via PERINEURAL

## 2020-02-19 MED ORDER — CEFAZOLIN SODIUM-DEXTROSE 2-4 GM/100ML-% IV SOLN
INTRAVENOUS | Status: AC
  Filled 2020-02-19: qty 100

## 2020-02-19 MED ORDER — BUPIVACAINE-EPINEPHRINE (PF) 0.5% -1:200000 IJ SOLN
INTRAMUSCULAR | Status: DC | PRN
Start: 2020-02-19 — End: 2020-02-19

## 2020-02-19 MED ORDER — VANCOMYCIN HCL 500 MG IV SOLR
INTRAVENOUS | Status: DC | PRN
  Administered 2020-02-19: 500 mg via TOPICAL

## 2020-02-19 MED ORDER — PROPOFOL 10 MG/ML IV BOLUS
INTRAVENOUS | Status: AC
  Filled 2020-02-19: qty 20

## 2020-02-19 MED ORDER — PHENYLEPHRINE 40 MCG/ML (10ML) SYRINGE FOR IV PUSH (FOR BLOOD PRESSURE SUPPORT)
PREFILLED_SYRINGE | INTRAVENOUS | Status: AC
  Filled 2020-02-19: qty 10

## 2020-02-19 MED ORDER — CEFAZOLIN SODIUM-DEXTROSE 2-4 GM/100ML-% IV SOLN
2.0000 g | INTRAVENOUS | Status: AC
  Administered 2020-02-19: 2 g via INTRAVENOUS

## 2020-02-19 MED ORDER — VANCOMYCIN HCL 500 MG IV SOLR
INTRAVENOUS | Status: AC
  Filled 2020-02-19: qty 500

## 2020-02-19 MED ORDER — ACETAMINOPHEN 500 MG PO TABS: 1000.0000 mg | ORAL_TABLET | Freq: Once | ORAL | Status: DC

## 2020-02-19 MED ORDER — LACTATED RINGERS IV SOLN: INTRAVENOUS | Status: DC

## 2020-02-19 MED ORDER — DEXAMETHASONE SODIUM PHOSPHATE 10 MG/ML IJ SOLN
INTRAMUSCULAR | Status: DC | PRN
  Administered 2020-02-19: 10 mg via INTRAVENOUS

## 2020-02-19 MED ORDER — SUCCINYLCHOLINE CHLORIDE 20 MG/ML IJ SOLN
INTRAMUSCULAR | Status: DC | PRN
  Administered 2020-02-19: 100 mg via INTRAVENOUS

## 2020-02-19 MED ORDER — CLONIDINE HCL (ANALGESIA) 100 MCG/ML EP SOLN
EPIDURAL | Status: DC | PRN
  Administered 2020-02-19: 67 ug
  Administered 2020-02-19: 33 ug

## 2020-02-19 MED ORDER — MIDAZOLAM HCL 2 MG/2ML IJ SOLN
2.0000 mg | Freq: Once | INTRAMUSCULAR | Status: AC
  Administered 2020-02-19: 1 mg via INTRAVENOUS

## 2020-02-19 MED ORDER — FENTANYL CITRATE (PF) 100 MCG/2ML IJ SOLN
100.0000 ug | Freq: Once | INTRAMUSCULAR | Status: AC
  Administered 2020-02-19: 50 ug via INTRAVENOUS

## 2020-02-19 MED ORDER — SUCCINYLCHOLINE CHLORIDE 200 MG/10ML IV SOSY
PREFILLED_SYRINGE | INTRAVENOUS | Status: AC
  Filled 2020-02-19: qty 10

## 2020-02-19 MED ORDER — OXYCODONE HCL 5 MG/5ML PO SOLN: 5.0000 mg | Freq: Once | ORAL | Status: DC | PRN

## 2020-02-19 MED ORDER — LIDOCAINE 2% (20 MG/ML) 5 ML SYRINGE
INTRAMUSCULAR | Status: AC
  Filled 2020-02-19: qty 5

## 2020-02-19 MED ORDER — LIDOCAINE 2% (20 MG/ML) 5 ML SYRINGE
INTRAMUSCULAR | Status: DC | PRN
  Administered 2020-02-19: 100 mg via INTRAVENOUS

## 2020-02-19 MED ORDER — OXYCODONE HCL 5 MG PO TABS
5.0000 mg | ORAL_TABLET | Freq: Four times a day (QID) | ORAL | 0 refills | Status: AC | PRN
Start: 2020-02-19 — End: 2020-02-24

## 2020-02-19 MED ORDER — ONDANSETRON HCL 4 MG/2ML IJ SOLN
INTRAMUSCULAR | Status: AC
  Filled 2020-02-19: qty 2

## 2020-02-19 MED ORDER — PROPOFOL 10 MG/ML IV BOLUS
INTRAVENOUS | Status: DC | PRN
  Administered 2020-02-19: 150 mg via INTRAVENOUS
  Administered 2020-02-19: 40 mg via INTRAVENOUS
  Administered 2020-02-19: 50 mg via INTRAVENOUS

## 2020-02-19 MED ORDER — PROMETHAZINE HCL 25 MG/ML IJ SOLN: 6.2500 mg | INTRAMUSCULAR | Status: DC | PRN

## 2020-02-19 MED ORDER — MIDAZOLAM HCL 2 MG/2ML IJ SOLN
INTRAMUSCULAR | Status: AC
  Filled 2020-02-19: qty 2

## 2020-02-19 MED ORDER — SODIUM CHLORIDE 0.9 % IV SOLN: INTRAVENOUS | Status: DC

## 2020-02-19 MED ORDER — PHENYLEPHRINE 40 MCG/ML (10ML) SYRINGE FOR IV PUSH (FOR BLOOD PRESSURE SUPPORT)
PREFILLED_SYRINGE | INTRAVENOUS | Status: DC | PRN
  Administered 2020-02-19: 40 ug via INTRAVENOUS
  Administered 2020-02-19: 80 ug via INTRAVENOUS
  Administered 2020-02-19: 40 ug via INTRAVENOUS

## 2020-02-19 MED ORDER — ONDANSETRON HCL 4 MG/2ML IJ SOLN
INTRAMUSCULAR | Status: DC | PRN
  Administered 2020-02-19: 4 mg via INTRAVENOUS

## 2020-02-19 MED ORDER — FENTANYL CITRATE (PF) 100 MCG/2ML IJ SOLN
INTRAMUSCULAR | Status: AC
  Filled 2020-02-19: qty 2

## 2020-02-19 MED ORDER — FENTANYL CITRATE (PF) 100 MCG/2ML IJ SOLN: 25.0000 ug | INTRAMUSCULAR | Status: DC | PRN

## 2020-02-19 SURGICAL SUPPLY — 85 items
APL PRP STRL LF DISP 70% ISPRP (MISCELLANEOUS) ×1
BANDAGE ESMARK 6X9 LF (GAUZE/BANDAGES/DRESSINGS) IMPLANT
BIT DRILL 2.5X2.75 QC CALB (BIT) ×1 IMPLANT
BIT DRILL 2.9 CANN QC NONSTRL (BIT) ×1 IMPLANT
BIT DRILL 3.5X5.5 QC CALB (BIT) ×1 IMPLANT
BLADE SURG 15 STRL LF DISP TIS (BLADE) ×2 IMPLANT
BLADE SURG 15 STRL SS (BLADE) ×4
BNDG CMPR 9X4 STRL LF SNTH (GAUZE/BANDAGES/DRESSINGS)
BNDG CMPR 9X6 STRL LF SNTH (GAUZE/BANDAGES/DRESSINGS)
BNDG COHESIVE 4X5 TAN STRL (GAUZE/BANDAGES/DRESSINGS) ×2 IMPLANT
BNDG COHESIVE 6X5 TAN STRL LF (GAUZE/BANDAGES/DRESSINGS) ×2 IMPLANT
BNDG ESMARK 4X9 LF (GAUZE/BANDAGES/DRESSINGS) IMPLANT
BNDG ESMARK 6X9 LF (GAUZE/BANDAGES/DRESSINGS)
CANISTER SUCT 1200ML W/VALVE (MISCELLANEOUS) ×2 IMPLANT
CHLORAPREP W/TINT 26 (MISCELLANEOUS) ×2 IMPLANT
COVER BACK TABLE 60X90IN (DRAPES) ×2 IMPLANT
COVER WAND RF STERILE (DRAPES) IMPLANT
CUFF TOURN SGL QUICK 34 (TOURNIQUET CUFF) ×2
CUFF TRNQT CYL 34X4.125X (TOURNIQUET CUFF) IMPLANT
DECANTER SPIKE VIAL GLASS SM (MISCELLANEOUS) IMPLANT
DRAPE EXTREMITY T 121X128X90 (DISPOSABLE) ×2 IMPLANT
DRAPE OEC MINIVIEW 54X84 (DRAPES) ×2 IMPLANT
DRAPE U-SHAPE 47X51 STRL (DRAPES) ×2 IMPLANT
DRSG MEPITEL 4X7.2 (GAUZE/BANDAGES/DRESSINGS) ×2 IMPLANT
DRSG PAD ABDOMINAL 8X10 ST (GAUZE/BANDAGES/DRESSINGS) ×4 IMPLANT
ELECT REM PT RETURN 9FT ADLT (ELECTROSURGICAL) ×2
ELECTRODE REM PT RTRN 9FT ADLT (ELECTROSURGICAL) ×1 IMPLANT
GAUZE SPONGE 4X4 12PLY STRL (GAUZE/BANDAGES/DRESSINGS) ×2 IMPLANT
GLOVE BIO SURGEON STRL SZ7 (GLOVE) ×1 IMPLANT
GLOVE BIO SURGEON STRL SZ8 (GLOVE) ×2 IMPLANT
GLOVE BIOGEL PI IND STRL 7.0 (GLOVE) IMPLANT
GLOVE BIOGEL PI IND STRL 8 (GLOVE) ×2 IMPLANT
GLOVE BIOGEL PI INDICATOR 7.0 (GLOVE) ×3
GLOVE BIOGEL PI INDICATOR 8 (GLOVE) ×1
GLOVE ECLIPSE 8.0 STRL XLNG CF (GLOVE) ×1 IMPLANT
GLOVE SURG SS PI 7.0 STRL IVOR (GLOVE) ×1 IMPLANT
GOWN STRL REUS W/ TWL LRG LVL3 (GOWN DISPOSABLE) ×1 IMPLANT
GOWN STRL REUS W/ TWL XL LVL3 (GOWN DISPOSABLE) ×2 IMPLANT
GOWN STRL REUS W/TWL LRG LVL3 (GOWN DISPOSABLE) ×4
GOWN STRL REUS W/TWL XL LVL3 (GOWN DISPOSABLE) ×2
K-WIRE ACE 1.6X6 (WIRE) ×2
KWIRE ACE 1.6X6 (WIRE) IMPLANT
NEEDLE HYPO 22GX1.5 SAFETY (NEEDLE) IMPLANT
NS IRRIG 1000ML POUR BTL (IV SOLUTION) ×2 IMPLANT
PAD CAST 4YDX4 CTTN HI CHSV (CAST SUPPLIES) ×1 IMPLANT
PADDING CAST ABS 4INX4YD NS (CAST SUPPLIES)
PADDING CAST ABS COTTON 4X4 ST (CAST SUPPLIES) IMPLANT
PADDING CAST COTTON 4X4 STRL (CAST SUPPLIES) ×2
PADDING CAST COTTON 6X4 STRL (CAST SUPPLIES) ×2 IMPLANT
PENCIL SMOKE EVACUATOR (MISCELLANEOUS) ×2 IMPLANT
PLATE ACE 100DEG 8HOLE (Plate) ×1 IMPLANT
PLATE ACE 3.5MM 2HOLE (Plate) ×1 IMPLANT
SANITIZER HAND PURELL 535ML FO (MISCELLANEOUS) ×2 IMPLANT
SCREW ACE CAN 4.0 42M (Screw) ×1 IMPLANT
SCREW CORTICAL 3.5MM  16MM (Screw) ×4 IMPLANT
SCREW CORTICAL 3.5MM  20MM (Screw) ×2 IMPLANT
SCREW CORTICAL 3.5MM 14MM (Screw) ×1 IMPLANT
SCREW CORTICAL 3.5MM 16MM (Screw) IMPLANT
SCREW CORTICAL 3.5MM 18MM (Screw) ×2 IMPLANT
SCREW CORTICAL 3.5MM 20MM (Screw) IMPLANT
SCREW CORTICAL 3.5MM 22MM (Screw) ×1 IMPLANT
SCREW CORTICAL 3.5MM 24MM (Screw) ×1 IMPLANT
SCREW CORTICAL 3.5MM 36MM (Screw) ×1 IMPLANT
SET BASIN DAY SURGERY F.S. (CUSTOM PROCEDURE TRAY) ×2 IMPLANT
SHEET MEDIUM DRAPE 40X70 STRL (DRAPES) ×2 IMPLANT
SLEEVE SCD COMPRESS KNEE MED (MISCELLANEOUS) ×2 IMPLANT
SPLINT FAST PLASTER 5X30 (CAST SUPPLIES) ×20
SPLINT PLASTER CAST FAST 5X30 (CAST SUPPLIES) ×20 IMPLANT
SPONGE LAP 18X18 RF (DISPOSABLE) ×2 IMPLANT
STOCKINETTE 6  STRL (DRAPES) ×2
STOCKINETTE 6 STRL (DRAPES) ×1 IMPLANT
SUCTION FRAZIER HANDLE 10FR (MISCELLANEOUS) ×2
SUCTION TUBE FRAZIER 10FR DISP (MISCELLANEOUS) ×1 IMPLANT
SUT ETHILON 3 0 PS 1 (SUTURE) ×3 IMPLANT
SUT FIBERWIRE #2 38 T-5 BLUE (SUTURE)
SUT MNCRL AB 3-0 PS2 18 (SUTURE) IMPLANT
SUT VIC AB 0 SH 27 (SUTURE) IMPLANT
SUT VIC AB 2-0 SH 27 (SUTURE) ×4
SUT VIC AB 2-0 SH 27XBRD (SUTURE) ×1 IMPLANT
SUTURE FIBERWR #2 38 T-5 BLUE (SUTURE) IMPLANT
SYR BULB EAR ULCER 3OZ GRN STR (SYRINGE) ×2 IMPLANT
SYR CONTROL 10ML LL (SYRINGE) IMPLANT
TOWEL GREEN STERILE FF (TOWEL DISPOSABLE) ×4 IMPLANT
TUBE CONNECTING 20X1/4 (TUBING) ×2 IMPLANT
UNDERPAD 30X36 HEAVY ABSORB (UNDERPADS AND DIAPERS) ×3 IMPLANT

## 2020-02-19 NOTE — Telephone Encounter (Signed)
Patient is aware and agreeable to Home Sleep Study through [facility]. Patient is scheduled for 04/30/20 at 10:30 to pick up home sleep kit and meet with Respiratory therapist at [facility]. Patient is aware that if this appointment date and time does not work for them they should contact Artis Delay directly at [phone]. Patient is aware that a sleep packet will be sent from [facility] in week. Patient is agreeable to treatment and thankful for call.

## 2020-02-19 NOTE — Transfer of Care (Signed)
Immediate Anesthesia Transfer of Care Note  Patient: Belinda Morgan  Procedure(s) Performed: Left ankle trimalleolar open reduction internal fixation (Left Ankle)  Patient Location: PACU  Anesthesia Type:GA combined with regional for post-op pain  Level of Consciousness: sedated  Airway & Oxygen Therapy: Patient Spontanous Breathing and Patient connected to face mask oxygen  Post-op Assessment: Report given to RN and Post -op Vital signs reviewed and stable  Post vital signs: Reviewed and stable  Last Vitals:  Vitals Value Taken Time  BP 91/54 02/19/20 1419  Temp    Pulse 81 02/19/20 1421  Resp 24 02/19/20 1421  SpO2 96 % 02/19/20 1421  Vitals shown include unvalidated device data.  Last Pain:  Vitals:   02/19/20 1203  TempSrc: Oral  PainSc: 0-No pain      Patients Stated Pain Goal: 3 (28/78/67 6720)  Complications: No complications documented.

## 2020-02-19 NOTE — Op Note (Addendum)
02/19/2020  2:19 PM  PATIENT:  Belinda Morgan  70 y.o. female  PRE-OPERATIVE DIAGNOSIS:  Left ankle trimalleolar fracture  POST-OPERATIVE DIAGNOSIS:  Left ankle trimalleolar fracture  Procedure(s): 1.  Open treatment of left ankle trimalleolar fracture with internal fixation including fixation of the posterior lip 2.  AP, lateral and mortise radiographs of the left ankle  SURGEON:  Wylene Simmer, MD  ASSISTANT: None  ANESTHESIA:   General, regional  EBL:  minimal   TOURNIQUET:   Total Tourniquet Time Documented: Thigh (Left) - 57 minutes Total: Thigh (Left) - 57 minutes  COMPLICATIONS:  None apparent  DISPOSITION:  Extubated, awake and stable to recovery.  INDICATION FOR PROCEDURE: The patient is a 69 year old female with a left ankle trimalleolar fracture.  She presents now for operative treatment of this displaced and unstable ankle fracture.  The risks and benefits of the alternative treatment options have been discussed in detail.  The patient wishes to proceed with surgery and specifically understands risks of bleeding, infection, nerve damage, blood clots, need for additional surgery, amputation and death.  PROCEDURE IN DETAIL:  After pre operative consent was obtained, and the correct operative site was identified, the patient was brought to the operating room and placed supine on the OR table.  Anesthesia was administered.  Pre-operative antibiotics were administered.  A surgical timeout was taken.  The left lower extremity was prepped and draped in standard sterile fashion with a tourniquet around the thigh.  The extremity was elevated and the tourniquet was inflated to 250 mmHg.  A longitudinal incision was made over the lateral malleolus.  Dissection was carried down through the subcutaneous tissues.  Fracture site was identified at the fibula.  It was cleaned of all hematoma and opened.  This allowed exposure of the posterior malleolus fracture which was mobilized and  reduced.  Attention was then turned to the medial aspect of the ankle where a longitudinal incision was made over the medial malleolus.  Dissection was carried down through the skin and subcutaneous tissues.  The fracture site was identified.  It was cleaned of all hematoma and reduced.  It was provisionally pinned.  A 2 hole one third tubular plate from the [employer] small frag set was then placed over the apex of the fracture at the posterior medial tibial cortex.  The plate was secured proximal to the fracture with a bicortical screw.  This compressed the fracture site appropriately.  Radiographs confirmed reduction.  The distal screw was then placed across the fracture site.  Attention was then returned to the lateral malleolus.  The posterior lateral fragment was reduced and held with a tenaculum.  A K wire was inserted across the fracture site.  Radiographs confirmed appropriate position of the K wire.  The K wire was overdrilled.  A 4 mm partially-threaded cannulated screw was inserted and was noted to have excellent purchase.  The lateral malleolus fracture was then reduced and held with 2 lobster claw tenaculae.  A 3.5 mm fully threaded lag screw was inserted from posterior to anterior across the fracture site.  It was noted to compress the fracture site appropriately.  An 8 hole one third tubular plate was then contoured to fit the lateral malleolus.  It was secured distally with 3 unicortical screws and proximally with 3 bicortical screws.  AP, lateral and mortise radiographs confirmed appropriate reduction of the medial, lateral and posterior malleolus fractures as well as appropriate position and length of all hardware.  Stress examination  showed no evidence of instability at the syndesmosis.  The wounds were irrigated copiously.  Vancomycin powder was sprinkled in the wounds.  Subcutaneous tissues were approximated with Vicryl.  Skin incisions were closed with nylon.  Sterile dressings were  applied followed by a well-padded short leg splint.  Tourniquet was released after application of the dressings.  The patient was awakened from anesthesia and transported to the recovery room in stable condition.  FOLLOW UP PLAN: Nonweightbearing on the left lower extremity.  Aspirin for DVT prophylaxis.  Follow-up in the office in 2 weeks for suture removal and conversion to a short leg cast.  Plan 6 weeks nonweightbearing immobilization postoperatively.   RADIOGRAPHS: AP, mortise and lateral radiographs of the left ankle are obtained intraoperatively.  These show interval reduction fixation of the trimalleolar ankle fracture including the posterior lip.  Hardware is appropriately positioned and of the appropriate lengths.  No other acute injuries are noted.

## 2020-02-19 NOTE — Anesthesia Procedure Notes (Addendum)
Anesthesia Regional Block: Popliteal block   Pre-Anesthetic Checklist: ,, timeout performed, Correct Patient, Correct Site, Correct Laterality, Correct Procedure, Correct Position, site marked, Risks and benefits discussed, pre-op evaluation,  At surgeon's request and post-op pain management  Laterality: Left  Prep: Maximum Sterile Barrier Precautions used, chloraprep       Needles:  Injection technique: Single-shot  Needle Type: Echogenic Stimulator Needle     Needle Length: 9cm  Needle Gauge: 22     Additional Needles:   Procedures:,,,, ultrasound used (permanent image in chart),,,,  Narrative:  Start time: 02/19/2020 12:22 PM End time: 02/19/2020 12:24 PM Injection made incrementally with aspirations every 5 mL.  Performed by: Personally  Anesthesiologist: Brennan Bailey, MD  Additional Notes: Risks, benefits, and alternative discussed. Patient gave consent for procedure. Patient prepped and draped in sterile fashion. Sedation administered, patient remains easily responsive to voice. Relevant anatomy identified with ultrasound guidance. Local anesthetic given in 5cc increments with no signs or symptoms of intravascular injection. No pain or paraesthesias with injection. Patient monitored throughout procedure with signs of LAST or immediate complications. Tolerated well. Ultrasound image placed in chart.  Tawny Asal, MD

## 2020-02-19 NOTE — Anesthesia Postprocedure Evaluation (Signed)
Anesthesia Post Note  Patient: Belinda Morgan  Procedure(s) Performed: Left ankle trimalleolar open reduction internal fixation (Left Ankle)     Patient location during evaluation: PACU Anesthesia Type: General Level of consciousness: awake and alert and oriented Pain management: pain level controlled Vital Signs Assessment: post-procedure vital signs reviewed and stable Respiratory status: spontaneous breathing, nonlabored ventilation and respiratory function stable Cardiovascular status: blood pressure returned to baseline Postop Assessment: no apparent nausea or vomiting Anesthetic complications: no   No complications documented.  Last Vitals:  Vitals:   02/19/20 1430 02/19/20 1432  BP: (!) 89/71 (!) 93/57  Pulse: 76 79  Resp: 18 11  Temp:    SpO2: 99% 95%    Last Pain:  Vitals:   02/19/20 1430  TempSrc:   PainSc: 0-No pain                 Brennan Bailey

## 2020-02-19 NOTE — H&P (Signed)
Belinda Morgan is an 69 y.o. female.   Chief Complaint: Left ankle injury HPI: The patient is a 69 year old female without significant past medical history.  She injured her left ankle last week.  Radiographs reveal a trimalleolar displaced ankle fracture.  She presents today for operative treatment of this displaced and unstable left ankle injury.  Past Medical History:  Diagnosis Date   Anemia    Hx of    Arthritis    COVID-19 11/29/2019   tested + at [facility], and unable to provide result   GERD (gastroesophageal reflux disease)    Heart murmur    Hyperlipemia    Hypertension    MVP (mitral valve prolapse)    hx of    Past Surgical History:  Procedure Laterality Date   APPENDECTOMY     laporoscopy     diagnostic    Family History  Problem Relation Age of Onset   Diabetes Brother        x 3    Cancer Mother    Colon polyps Brother    Colon cancer Neg Hx    Esophageal cancer Neg Hx    Rectal cancer Neg Hx    Stomach cancer Neg Hx    Social History:  reports that she quit smoking about 28 years ago. She has never used smokeless tobacco. She reports that she does not drink alcohol and does not use drugs.  Allergies:  Allergies  Allergen Reactions   Aspirin     Not allergic, but irritates stomach    E-Mycin [Erythromycin]     Upset her stomach very badly    Facility-Administered Medications Prior to Admission  Medication Dose Route Frequency Provider Last Rate Last Admin   0.9 %  sodium chloride infusion  500 mL Intravenous Once Pyrtle, Lajuan Lines, MD       Medications Prior to Admission  Medication Sig Dispense Refill   AmLODIPine Besylate (NORVASC PO) Take 5 mg by mouth daily.      ibuprofen (ADVIL) 600 MG tablet Take 1 tablet (600 mg total) by mouth every 6 (six) hours as needed for up to 30 doses. 30 tablet 0   OXYCODONE HCL PO Take by mouth.     traMADol (ULTRAM) 50 MG tablet Take 50 mg by mouth every 8 (eight) hours as needed.        triamterene-hydrochlorothiazide (DYAZIDE) 37.5-25 MG capsule Take 1 capsule by mouth daily.  1   metoprolol tartrate (LOPRESSOR) 100 MG tablet Take 1 tablet (100 mg total) by mouth once for 1 dose. Take 1 tablet (100 mg) two hours prior to CT scan 1 tablet 0    No results found for this or any previous visit (from the past 48 hour(s)). No results found.  Review of Systems no recent fever, chills, nausea, vomiting or changes in her appetite  Blood pressure 119/78, pulse (!) 104, temperature 99.1 F (37.3 C), temperature source Oral, resp. rate 15, height 5\' 5"  (1.651 m), weight 74.4 kg, SpO2 99 %. Physical Exam  Well-nourished well-developed woman in no apparent distress.  Alert and oriented x4.  Normal mood and affect.  Left ankle with healthy skin.  No blisters.  Skin wrinkles appropriately.  Pulses are palpable in the foot.  Intact sensibility to light touch dorsally and plantarly at the forefoot.  5 out of 5 strength in plantarflexion and dorsiflexion of the toes.  Assessment/Plan Left ankle trimalleolar fracture-to the operating room today for open treatment with internal fixation.  The  risks and benefits of the alternative treatment options have been discussed in detail.  The patient wishes to proceed with surgery and specifically understands risks of bleeding, infection, nerve damage, blood clots, need for additional surgery, amputation and death.   Wylene Simmer, MD 2020-02-22, 12:18 PM

## 2020-02-19 NOTE — Progress Notes (Signed)
Assisted Dr. Daiva Huge with left, ultrasound guided, popliteal, adductor canal block. Side rails up, monitors on throughout procedure. See vital signs in flow sheet. Tolerated Procedure well.

## 2020-02-19 NOTE — Anesthesia Procedure Notes (Signed)
Procedure Name: Intubation Performed by: Tawni Millers, CRNA Pre-anesthesia Checklist: Patient identified, Emergency Drugs available, Suction available and Patient being monitored Patient Re-evaluated:Patient Re-evaluated prior to induction Oxygen Delivery Method: Circle system utilized Preoxygenation: Pre-oxygenation with 100% oxygen Induction Type: IV induction Ventilation: Mask ventilation without difficulty Laryngoscope Size: Mac and 3 Grade View: Grade I Tube type: Oral Tube size: 7.5 mm Number of attempts: 1 Airway Equipment and Method: Stylet and Oral airway Placement Confirmation: ETT inserted through vocal cords under direct vision,  positive ETCO2 and breath sounds checked- equal and bilateral Tube secured with: Tape Dental Injury: Teeth and Oropharynx as per pre-operative assessment

## 2020-02-19 NOTE — Telephone Encounter (Signed)
Lauralee Evener, CMA  Freada Bergeron, CMA Ok to schedule [employer] does not require a PA

## 2020-02-19 NOTE — Discharge Instructions (Addendum)
Belinda Simmer, MD [facility]  Please read the following information regarding your care after surgery.  Medications  You only need a prescription for the narcotic pain medicine (ex. oxycodone, Percocet, Norco).  All of the other medicines listed below are available over the counter. X Ibuprofen 600 mg every 8 hours for the first 3-5 days after surgery. X acetominophen (Tylenol) 650 mg every 4-6 hours as you need for minor to moderate pain X oxycodone as prescribed for severe pain  Narcotic pain medicine (ex. oxycodone, Percocet, Vicodin) will cause constipation.  To prevent this problem, take the following medicines while you are taking any pain medicine. X docusate sodium (Colace) 100 mg twice a day ? senna (Senokot) 2 tablets twice a day  X To help prevent blood clots, take a baby aspirin (81 mg) twice a day for two weeks after surgery.  You should also get up every hour while you are awake to move around.    Weight Bearing ? Bear weight when you are able on your operated leg or foot. ? Bear weight only on your operated foot in the post-op shoe. X Do not bear any weight on the operated leg or foot.  Cast / Splint / Dressing X Keep your splint, cast or dressing clean and dry.  Don't put anything (coat hanger, pencil, etc) down inside of it.  If it gets damp, use a hair dryer on the cool setting to dry it.  If it gets soaked, call the office to schedule an appointment for a cast change. ? Remove your dressing 3 days after surgery and cover the incisions with dry dressings.    After your dressing, cast or splint is removed; you may shower, but do not soak or scrub the wound.  Allow the water to run over it, and then gently pat it dry.  Swelling It is normal for you to have swelling where you had surgery.  To reduce swelling and pain, keep your toes above your nose for at least 3 days after surgery.  It may be necessary to keep your foot or leg elevated for several weeks.  If it hurts, it  should be elevated.  Follow Up Call my office at [phone] when you are discharged from the hospital or surgery center to schedule an appointment to be seen two weeks after surgery.  Call my office at [phone] if you develop a fever >101.5 F, nausea, vomiting, bleeding from the surgical site or severe pain.     Post Anesthesia Home Care Instructions  Activity: Get plenty of rest for the remainder of the day. A responsible individual must stay with you for 24 hours following the procedure.  For the next 24 hours, DO NOT: -Drive a car -Paediatric nurse -Drink alcoholic beverages -Take any medication unless instructed by your physician -Make any legal decisions or sign important papers.  Meals: Start with liquid foods such as gelatin or soup. Progress to regular foods as tolerated. Avoid greasy, spicy, heavy foods. If nausea and/or vomiting occur, drink only clear liquids until the nausea and/or vomiting subsides. Call your physician if vomiting continues.  Special Instructions/Symptoms: Your throat may feel dry or sore from the anesthesia or the breathing tube placed in your throat during surgery. If this causes discomfort, gargle with warm salt water. The discomfort should disappear within 24 hours.  If you had a scopolamine patch placed behind your ear for the management of post- operative nausea and/or vomiting:  1. The medication in the patch is  effective for 72 hours, after which it should be removed.  Wrap patch in a tissue and discard in the trash. Wash hands thoroughly with soap and water. 2. You may remove the patch earlier than 72 hours if you experience unpleasant side effects which may include dry mouth, dizziness or visual disturbances. 3. Avoid touching the patch. Wash your hands with soap and water after contact with the patch.    Regional Anesthesia Blocks  1. Numbness or the inability to move the "blocked" extremity may last from 3-48 hours after placement.  The length of time depends on the medication injected and your individual response to the medication. If the numbness is not going away after 48 hours, call your surgeon.  2. The extremity that is blocked will need to be protected until the numbness is gone and the  Strength has returned. Because you cannot feel it, you will need to take extra care to avoid injury. Because it may be weak, you may have difficulty moving it or using it. You may not know what position it is in without looking at it while the block is in effect.  3. For blocks in the legs and feet, returning to weight bearing and walking needs to be done carefully. You will need to wait until the numbness is entirely gone and the strength has returned. You should be able to move your leg and foot normally before you try and bear weight or walk. You will need someone to be with you when you first try to ensure you do not fall and possibly risk injury.  4. Bruising and tenderness at the needle site are common side effects and will resolve in a few days.  5. Persistent numbness or new problems with movement should be communicated to the surgeon or the [place] [phone] [place] [phone]).

## 2020-02-19 NOTE — Anesthesia Procedure Notes (Addendum)
Anesthesia Regional Block: Adductor canal block   Pre-Anesthetic Checklist: ,, timeout performed, Correct Patient, Correct Site, Correct Laterality, Correct Procedure, Correct Position, site marked, Risks and benefits discussed, pre-op evaluation,  At surgeon's request and post-op pain management  Laterality: Left  Prep: Maximum Sterile Barrier Precautions used, chloraprep       Needles:  Injection technique: Single-shot  Needle Type: Echogenic Stimulator Needle     Needle Length: 9cm  Needle Gauge: 22     Additional Needles:   Procedures:,,,, ultrasound used (permanent image in chart),,,,  Narrative:  Start time: 02/19/2020 12:20 PM End time: 02/19/2020 12:22 PM Injection made incrementally with aspirations every 5 mL.  Performed by: Personally  Anesthesiologist: Brennan Bailey, MD  Additional Notes: Risks, benefits, and alternative discussed. Patient gave consent for procedure. Patient prepped and draped in sterile fashion. Sedation administered, patient remains easily responsive to voice. Relevant anatomy identified with ultrasound guidance. Local anesthetic given in 5cc increments with no signs or symptoms of intravascular injection. No pain or paraesthesias with injection. Patient monitored throughout procedure with signs of LAST or immediate complications. Tolerated well. Ultrasound image placed in chart.  Tawny Asal, MD

## 2020-02-20 ENCOUNTER — Encounter (HOSPITAL_BASED_OUTPATIENT_CLINIC_OR_DEPARTMENT_OTHER): Payer: Self-pay | Admitting: Orthopedic Surgery

## 2020-03-17 DIAGNOSIS — S82202A Unspecified fracture of shaft of left tibia, initial encounter for closed fracture: Secondary | ICD-10-CM | POA: Diagnosis not present

## 2020-03-17 DIAGNOSIS — S82402A Unspecified fracture of shaft of left fibula, initial encounter for closed fracture: Secondary | ICD-10-CM | POA: Diagnosis not present

## 2020-03-18 ENCOUNTER — Other Ambulatory Visit: Payer: Self-pay | Admitting: Internal Medicine

## 2020-03-18 ENCOUNTER — Other Ambulatory Visit: Payer: Self-pay | Admitting: Cardiovascular Disease

## 2020-03-18 DIAGNOSIS — R5381 Other malaise: Secondary | ICD-10-CM

## 2020-03-19 ENCOUNTER — Other Ambulatory Visit: Payer: Self-pay | Admitting: Internal Medicine

## 2020-03-19 DIAGNOSIS — Z13820 Encounter for screening for osteoporosis: Secondary | ICD-10-CM

## 2020-03-22 DIAGNOSIS — Z8781 Personal history of (healed) traumatic fracture: Secondary | ICD-10-CM | POA: Diagnosis not present

## 2020-03-22 DIAGNOSIS — M25561 Pain in right knee: Secondary | ICD-10-CM | POA: Diagnosis not present

## 2020-03-24 ENCOUNTER — Other Ambulatory Visit: Payer: Self-pay

## 2020-03-24 ENCOUNTER — Ambulatory Visit
Admission: RE | Admit: 2020-03-24 | Discharge: 2020-03-24 | Disposition: A | Discharge status: Home / Self Care | Payer: Medicare PPO | Source: Ambulatory Visit | Attending: Internal Medicine | Admitting: Internal Medicine

## 2020-03-24 DIAGNOSIS — Z13820 Encounter for screening for osteoporosis: Secondary | ICD-10-CM

## 2020-03-29 DIAGNOSIS — Z4889 Encounter for other specified surgical aftercare: Secondary | ICD-10-CM | POA: Diagnosis not present

## 2020-03-29 DIAGNOSIS — S82852D Displaced trimalleolar fracture of left lower leg, subsequent encounter for closed fracture with routine healing: Secondary | ICD-10-CM | POA: Diagnosis not present

## 2020-03-29 DIAGNOSIS — M25572 Pain in left ankle and joints of left foot: Secondary | ICD-10-CM | POA: Diagnosis not present

## 2020-04-01 ENCOUNTER — Ambulatory Visit
Admission: RE | Admit: 2020-04-01 | Discharge: 2020-04-01 | Disposition: A | Discharge status: Home / Self Care | Payer: Medicare PPO | Source: Ambulatory Visit | Attending: Internal Medicine | Admitting: Internal Medicine

## 2020-04-01 ENCOUNTER — Other Ambulatory Visit: Payer: Self-pay

## 2020-04-01 DIAGNOSIS — Z780 Asymptomatic menopausal state: Secondary | ICD-10-CM | POA: Diagnosis not present

## 2020-04-17 DIAGNOSIS — S82402A Unspecified fracture of shaft of left fibula, initial encounter for closed fracture: Secondary | ICD-10-CM | POA: Diagnosis not present

## 2020-04-17 DIAGNOSIS — S82202A Unspecified fracture of shaft of left tibia, initial encounter for closed fracture: Secondary | ICD-10-CM | POA: Diagnosis not present

## 2020-04-28 DIAGNOSIS — S82852D Displaced trimalleolar fracture of left lower leg, subsequent encounter for closed fracture with routine healing: Secondary | ICD-10-CM | POA: Diagnosis not present

## 2020-04-28 DIAGNOSIS — Z4889 Encounter for other specified surgical aftercare: Secondary | ICD-10-CM | POA: Diagnosis not present

## 2020-04-30 ENCOUNTER — Encounter (HOSPITAL_BASED_OUTPATIENT_CLINIC_OR_DEPARTMENT_OTHER): Payer: Medicare PPO | Admitting: Cardiology

## 2020-05-18 DIAGNOSIS — S82402A Unspecified fracture of shaft of left fibula, initial encounter for closed fracture: Secondary | ICD-10-CM | POA: Diagnosis not present

## 2020-05-18 DIAGNOSIS — S82202A Unspecified fracture of shaft of left tibia, initial encounter for closed fracture: Secondary | ICD-10-CM | POA: Diagnosis not present

## 2020-05-20 DIAGNOSIS — M25572 Pain in left ankle and joints of left foot: Secondary | ICD-10-CM | POA: Diagnosis not present

## 2020-05-24 DIAGNOSIS — M25572 Pain in left ankle and joints of left foot: Secondary | ICD-10-CM | POA: Diagnosis not present

## 2020-05-26 DIAGNOSIS — M25572 Pain in left ankle and joints of left foot: Secondary | ICD-10-CM | POA: Diagnosis not present

## 2020-05-26 DIAGNOSIS — S82852D Displaced trimalleolar fracture of left lower leg, subsequent encounter for closed fracture with routine healing: Secondary | ICD-10-CM | POA: Diagnosis not present

## 2020-06-02 DIAGNOSIS — M25572 Pain in left ankle and joints of left foot: Secondary | ICD-10-CM | POA: Diagnosis not present

## 2020-06-04 DIAGNOSIS — M25572 Pain in left ankle and joints of left foot: Secondary | ICD-10-CM | POA: Diagnosis not present

## 2020-06-08 DIAGNOSIS — M25572 Pain in left ankle and joints of left foot: Secondary | ICD-10-CM | POA: Diagnosis not present

## 2020-06-11 DIAGNOSIS — M25572 Pain in left ankle and joints of left foot: Secondary | ICD-10-CM | POA: Diagnosis not present

## 2020-06-17 DIAGNOSIS — S82402A Unspecified fracture of shaft of left fibula, initial encounter for closed fracture: Secondary | ICD-10-CM | POA: Diagnosis not present

## 2020-06-17 DIAGNOSIS — S82202A Unspecified fracture of shaft of left tibia, initial encounter for closed fracture: Secondary | ICD-10-CM | POA: Diagnosis not present

## 2020-06-18 DIAGNOSIS — M25572 Pain in left ankle and joints of left foot: Secondary | ICD-10-CM | POA: Diagnosis not present

## 2020-07-01 DIAGNOSIS — H43393 Other vitreous opacities, bilateral: Secondary | ICD-10-CM | POA: Diagnosis not present

## 2020-07-06 DIAGNOSIS — Z1231 Encounter for screening mammogram for malignant neoplasm of breast: Secondary | ICD-10-CM | POA: Diagnosis not present

## 2020-07-18 DIAGNOSIS — S82202A Unspecified fracture of shaft of left tibia, initial encounter for closed fracture: Secondary | ICD-10-CM | POA: Diagnosis not present

## 2020-07-18 DIAGNOSIS — S82402A Unspecified fracture of shaft of left fibula, initial encounter for closed fracture: Secondary | ICD-10-CM | POA: Diagnosis not present

## 2020-08-17 DIAGNOSIS — S82402A Unspecified fracture of shaft of left fibula, initial encounter for closed fracture: Secondary | ICD-10-CM | POA: Diagnosis not present

## 2020-08-17 DIAGNOSIS — S82202A Unspecified fracture of shaft of left tibia, initial encounter for closed fracture: Secondary | ICD-10-CM | POA: Diagnosis not present

## 2020-08-19 DIAGNOSIS — E785 Hyperlipidemia, unspecified: Secondary | ICD-10-CM | POA: Diagnosis not present

## 2020-08-19 DIAGNOSIS — R7301 Impaired fasting glucose: Secondary | ICD-10-CM | POA: Diagnosis not present

## 2020-08-19 DIAGNOSIS — I10 Essential (primary) hypertension: Secondary | ICD-10-CM | POA: Diagnosis not present

## 2020-08-26 DIAGNOSIS — I10 Essential (primary) hypertension: Secondary | ICD-10-CM | POA: Diagnosis not present

## 2020-08-26 DIAGNOSIS — Z1339 Encounter for screening examination for other mental health and behavioral disorders: Secondary | ICD-10-CM | POA: Diagnosis not present

## 2020-08-26 DIAGNOSIS — I129 Hypertensive chronic kidney disease with stage 1 through stage 4 chronic kidney disease, or unspecified chronic kidney disease: Secondary | ICD-10-CM | POA: Diagnosis not present

## 2020-08-26 DIAGNOSIS — Z1212 Encounter for screening for malignant neoplasm of rectum: Secondary | ICD-10-CM | POA: Diagnosis not present

## 2020-08-26 DIAGNOSIS — H8110 Benign paroxysmal vertigo, unspecified ear: Secondary | ICD-10-CM | POA: Diagnosis not present

## 2020-08-26 DIAGNOSIS — Z8616 Personal history of COVID-19: Secondary | ICD-10-CM | POA: Diagnosis not present

## 2020-08-26 DIAGNOSIS — Z1331 Encounter for screening for depression: Secondary | ICD-10-CM | POA: Diagnosis not present

## 2020-08-26 DIAGNOSIS — R82998 Other abnormal findings in urine: Secondary | ICD-10-CM | POA: Diagnosis not present

## 2020-08-26 DIAGNOSIS — N182 Chronic kidney disease, stage 2 (mild): Secondary | ICD-10-CM | POA: Diagnosis not present

## 2020-08-26 DIAGNOSIS — E785 Hyperlipidemia, unspecified: Secondary | ICD-10-CM | POA: Diagnosis not present

## 2020-08-26 DIAGNOSIS — Z0000 Encounter for general adult medical examination without abnormal findings: Secondary | ICD-10-CM | POA: Diagnosis not present

## 2020-09-17 DIAGNOSIS — S82402A Unspecified fracture of shaft of left fibula, initial encounter for closed fracture: Secondary | ICD-10-CM | POA: Diagnosis not present

## 2020-09-17 DIAGNOSIS — S82202A Unspecified fracture of shaft of left tibia, initial encounter for closed fracture: Secondary | ICD-10-CM | POA: Diagnosis not present

## 2020-10-11 ENCOUNTER — Encounter: Payer: Self-pay | Admitting: Internal Medicine

## 2020-10-18 DIAGNOSIS — S82202A Unspecified fracture of shaft of left tibia, initial encounter for closed fracture: Secondary | ICD-10-CM | POA: Diagnosis not present

## 2020-10-18 DIAGNOSIS — S82402A Unspecified fracture of shaft of left fibula, initial encounter for closed fracture: Secondary | ICD-10-CM | POA: Diagnosis not present

## 2020-10-25 DIAGNOSIS — M79672 Pain in left foot: Secondary | ICD-10-CM | POA: Diagnosis not present

## 2020-10-25 DIAGNOSIS — M6701 Short Achilles tendon (acquired), right ankle: Secondary | ICD-10-CM | POA: Diagnosis not present

## 2020-10-25 DIAGNOSIS — S82852D Displaced trimalleolar fracture of left lower leg, subsequent encounter for closed fracture with routine healing: Secondary | ICD-10-CM | POA: Diagnosis not present

## 2020-11-15 DIAGNOSIS — S82202A Unspecified fracture of shaft of left tibia, initial encounter for closed fracture: Secondary | ICD-10-CM | POA: Diagnosis not present

## 2020-11-15 DIAGNOSIS — S82402A Unspecified fracture of shaft of left fibula, initial encounter for closed fracture: Secondary | ICD-10-CM | POA: Diagnosis not present

## 2020-11-17 DIAGNOSIS — Z01419 Encounter for gynecological examination (general) (routine) without abnormal findings: Secondary | ICD-10-CM | POA: Diagnosis not present

## 2020-11-19 ENCOUNTER — Encounter: Payer: Self-pay | Admitting: Physical Therapy

## 2020-11-19 ENCOUNTER — Ambulatory Visit: Payer: Medicare PPO | Attending: Orthopedic Surgery | Admitting: Physical Therapy

## 2020-11-19 ENCOUNTER — Other Ambulatory Visit: Payer: Self-pay

## 2020-11-19 DIAGNOSIS — M6281 Muscle weakness (generalized): Secondary | ICD-10-CM | POA: Insufficient documentation

## 2020-11-19 DIAGNOSIS — M25572 Pain in left ankle and joints of left foot: Secondary | ICD-10-CM | POA: Insufficient documentation

## 2020-11-19 DIAGNOSIS — M25672 Stiffness of left ankle, not elsewhere classified: Secondary | ICD-10-CM

## 2020-11-19 DIAGNOSIS — R209 Unspecified disturbances of skin sensation: Secondary | ICD-10-CM | POA: Diagnosis not present

## 2020-11-19 NOTE — Therapy (Addendum)
[place], [place], [address] Phone: [phone]   Fax:  [phone]  Physical Therapy Evaluation  Patient Details  Name: Belinda Morgan MRN: [id number] Date of Birth: 1950/10/29 Referring Provider (PT): Wylene Simmer, MD   Encounter Date: 11/19/2020   PT End of Session - 11/19/20 1021     Visit Number 1    Number of Visits 8    Date for PT Re-Evaluation 01/14/21    Authorization Type [employer] MCR    PT Start Time 1000    PT Stop Time 1051    PT Time Calculation (min) 51 min    Activity Tolerance Patient tolerated treatment well    Behavior During Therapy [facility] for tasks assessed/performed             Past Medical History:  Diagnosis Date   Anemia    Hx of    Arthritis    COVID-19 11/29/2019   [facility], and unable to provide result   GERD (gastroesophageal reflux disease)    Heart murmur    Hyperlipemia    Hypertension    MVP (mitral valve prolapse)    hx of    Past Surgical History:  Procedure Laterality Date   APPENDECTOMY     laporoscopy     diagnostic   ORIF ANKLE FRACTURE Left 02/19/2020   Procedure: Left ankle trimalleolar open reduction internal fixation;  Surgeon: Wylene Simmer, MD;  Location: [facility];  Service: Orthopedics;  Laterality: Left;    There were no vitals filed for this visit.    Subjective Assessment - 11/19/20 1005     Subjective I broke my ankle in June 2021. I was having PT in October but then stopped. I feel like I was plateauing and not really getting better. Mostly what we did in PT was just stretching since I still couldn't bear weight on my foot. I feel like I have tightness on the top of my foot and an odd sensation on the bottom of my foot like a sock is scrunched up on the bottom of my foot. Most of the discomofrt that I have comes at night in bed with the covers on top of my foot. The doctor said that I have nerve damage and that I  will go back in 6 weeks to discuss further tx options. For now he has put me on gabapenting but it doesn't seem to help. My foot gets swollen after a long day.    How long can you sit comfortably? 30 minutes then starts to swell at night    How long can you stand comfortably? 'it's just there'    How long can you walk comfortably? about lenght of [employer] before it starts to swell    Diagnostic tests CT L ankle 5/29 'Comminuted fracture of the distal tibia as described.  2. Fracture of the distal fibular shaft.  3. Widening of the anterior and lateral ankle joint space.  4. Widening of the distal tibiofibular syndesmosis'    Patient Stated Goals just feel better, wear regular pair of shoes, 'be normal'    Currently in Pain? Yes    Pain Score 0-No pain   discomfort level, she has no pain only discomfort   Pain Location Foot    Pain Orientation Left    Pain Descriptors / Indicators Nagging;Discomfort    Pain Type Chronic pain;Surgical pain    Pain Radiating Towards lateral side of the foot  Pain Onset More than a month ago    Pain Frequency Intermittent    Aggravating Factors  sleeping, having shoes on for long periods of time    Pain Relieving Factors ice                  [facility] PT Assessment - 11/19/20 0001       Assessment   Medical Diagnosis Displaced trimalleolar fracture of left lower leg    Referring Provider (PT) Wylene Simmer, MD    Onset Date/Surgical Date 02/18/21    Next MD Visit 12/06/2020    Prior Therapy September-October 2021      Precautions   Precautions None      Restrictions   Weight Bearing Restrictions No      Balance Screen   Has the patient fallen in the past 6 months Yes    How many times? 1   caused trimalleolar fx   Has the patient had a decrease in activity level because of a fear of falling?  Yes    Is the patient reluctant to leave their home because of a fear of falling?  No      Home Environment   Living Environment Private residence     Living Arrangements Spouse/significant other    Type of [place] Access Level entry    [place] Two level    Alternate Level Stairs-Number of Steps 15    [place] --   stair lift   Additional Comments stair lift for her husband and she sometimes uses it      Prior Function   Level of Independence Independent    Vocation Retired    Leisure gardening, walking      Cognition   Overall Cognitive Status Within Functional Limits for tasks assessed      Observation/Other Assessments   Observations pt in no apparent distress    Skin Integrity L scar above lateral malleolus healed well, not tested for scar mobility    Focus on Therapeutic Outcomes (FOTO)  FOTO: 64% functional status (predicted 70%)      Sensation   Light Touch Impaired Detail    Light Touch Impaired Details Impaired LLE    Additional Comments L foot 3rd and 5th toe difference in light touch as well as on bottom of foot and behind lateral malleolus      Coordination   Gross Motor Movements are Fluid and Coordinated No    Coordination and Movement Description impairment in fluid movement into inversion and eversion      ROM / Strength   AROM / PROM / Strength AROM;PROM;Strength      AROM   Overall AROM  Deficits    Overall AROM Comments due to lack of mobility    AROM Assessment Site Ankle    Right/Left Ankle Right;Left    Right Ankle Dorsiflexion --   lacking 5   Right Ankle Plantar Flexion 55    Right Ankle Inversion 35    Right Ankle Eversion 35    Left Ankle Dorsiflexion --   lacking 5   Left Ankle Plantar Flexion 20    Left Ankle Inversion 25    Left Ankle Eversion --   lacking 1     PROM   Overall PROM  Deficits    Overall PROM Comments do to discomfort and lack of mobility    PROM Assessment Site Ankle    Right/Left Ankle Right;Left    Right Ankle  Dorsiflexion 0    Right Ankle Plantar Flexion 56    Right Ankle Inversion 35    Right Ankle Eversion 35    Left Ankle Dorsiflexion --    lacking 3   Left Ankle Plantar Flexion 25    Left Ankle Inversion 28    Left Ankle Eversion 0      Strength   Overall Strength Within functional limits for tasks performed    Strength Assessment Site Ankle    Right/Left Ankle Right;Left    Right Ankle Dorsiflexion 5/5    Right Ankle Plantar Flexion 5/5    Right Ankle Inversion 5/5    Right Ankle Eversion 5/5    Left Ankle Dorsiflexion 5/5    Left Ankle Plantar Flexion 5/5    Left Ankle Inversion 5/5    Left Ankle Eversion 5/5                                Objective measurements completed on examination: See above findings.          [place] Adult PT Treatment/Exercise - 11/19/20 0001       Exercises   Exercises Ankle      Ankle Exercises: Stretches   Gastroc Stretch 3 reps;30 seconds    Gastroc Stretch Limitations runners lunge, no stretch noted with strap in supine      Ankle Exercises: Standing   Toe Raise 10 reps    Toe Raise Limitations cueing for slow eccentric      Ankle Exercises: Supine   T-Band 3 way except plantarflexion with red Tband x10                          PT Education - 11/19/20 1021     Education Details HEP, sx explanation, POC    Person(s) Educated Patient    Methods Explanation;Demonstration;Tactile cues;Handout;Verbal cues    Comprehension Verbalized understanding;Need further instruction              PT Short Term Goals - 11/19/20 1123       PT SHORT TERM GOAL #1   Title Pt will be independent with intial HEP.    Time 3    Period Weeks    Status New    Target Date 12/10/20      PT SHORT TERM GOAL #2   Title PT will go over FOTO results with pt by 3rd visit    Time 3    Period Weeks    Status New    Target Date 12/10/20                PT Long Term Goals - 11/19/20 1123       PT LONG TERM GOAL #1   Title Pt will be able to demonstrate 20 SL calf raises in order to demonstrate proper 5/5 gastroc L MMT    Time 8    Period Weeks     Status New    Target Date 01/14/21      PT LONG TERM GOAL #2   Title Pt will go from a 64% to a 70% FOTO score to show functional improvement    Time 8    Period Weeks    Status New    Target Date 01/14/21      PT LONG TERM GOAL #3   Title Pt will be able to achieve 40 degrees AROM plantarflexion  in order to help with ambulation    Time 8    Period Weeks    Status New    Target Date 01/14/21      PT LONG TERM GOAL #4   Title Pt will be able to get to L ankl eversion AROM 10 degrees in order to help with ambulation.    Time 8    Period Weeks    Status New    Target Date 01/14/21      PT LONG TERM GOAL #5   Title Pt will be able to get to 0 degrees of AROM dorsiflexion in order to help with ambulation    Time 8    Period Weeks    Status New    Target Date 01/14/21                         Plan - 11/19/20 1114     Clinical Impression Statement Pt is a 70 y/o F presenting to PT with c/o L ankle pain s/p displaced trimalleolar fx of L lower leg back in June 2021. Examination revealed slight edema in the L foot, lack of ROM in all directions, especially eversion, as well as decreased LE strength mostly in the standing position, as well as decreased sensation in the L foot. Differential dx for lack of mobility due to scar tissue, joint limitations, or swelling. Pt would benefit from skilled PT in order to increase ROM and standing strength in the L LE in order to increase ambulation distance, help with edema management, and L LE strength in order to increase community ambulation and help with pain mangement at night.    Personal Factors and Comorbidities Time since onset of injury/illness/exacerbation;Comorbidity 3+;Age;Fitness    Comorbidities anemia, arthritis, HTN, MVP, heart murmur    Examination-Activity Limitations Bed Mobility;Bend;Caring for Others;Locomotion Level;Sleep;Stairs;Stand    Examination-Participation Restrictions Community Activity;Yard  Work;Volunteer;Shop;Laundry    Stability/Clinical Decision Making Stable/Uncomplicated    Clinical Decision Making Low    Rehab Potential Good    PT Frequency 1x / week    PT Duration 8 weeks    PT Treatment/Interventions ADLs/Self Care Home Management;Moist Heat;Cryotherapy;Ultrasound;Gait training;Stair training;Functional mobility training;Therapeutic activities;Therapeutic exercise;Balance training;Compression bandaging;Patient/family education;Manual techniques;Passive range of motion;Scar mobilization;Joint Manipulations;Taping    PT Next Visit Plan scar mobilization PRN, progress 3 way ankle, progress calf raises, joint mobilization talocrural and subtalar    PT Home Exercise Plan [email or web address]    Consulted and Agree with Plan of Care Patient             Patient will benefit from skilled therapeutic intervention in order to improve the following deficits and impairments:  Abnormal gait,Difficulty walking,Pain,Impaired sensation,Decreased strength,Decreased mobility,Hypomobility,Decreased scar mobility,Decreased activity tolerance,Impaired flexibility,Decreased balance  Visit Diagnosis: Stiffness of left ankle, not elsewhere classified  Muscle weakness (generalized)  Pain in left ankle and joints of left foot  Unspecified disturbances of skin sensation      Problem List Patient Active Problem List   Diagnosis Date Noted   Blood in urine 08/28/2017   Hemorrhage of rectum and anus 06/21/2011    Caleb Popp, SPT 11/19/2020, 11:27 AM  [place], [place], [address] Phone: [phone]   Fax:  [phone]  Name: TZIVIA ONEIL MRN: [id number] Date of Birth: August 30, 1951

## 2020-11-19 NOTE — Patient Instructions (Signed)
Access Code: [email or web address] URL: [email or web address] Date: 11/19/2020 Prepared by: Caleb Popp  Exercises  Ankle Dorsiflexion with Resistance - 1 x daily - 7 x weekly - 2 sets - 10 reps Long Sitting Ankle Inversion with Resistance - 1 x daily - 7 x weekly - 2 sets - 10 reps Long Sitting Ankle Eversion with Resistance - 1 x daily - 7 x weekly - 2 sets - 10 reps Gastroc Stretch on Wall - 1 x daily - 7 x weekly - 3 sets - 30 hold Standing Heel Raise - 1 x daily - 7 x weekly - 2 sets - 10 reps

## 2020-11-26 ENCOUNTER — Other Ambulatory Visit: Payer: Self-pay

## 2020-11-26 ENCOUNTER — Ambulatory Visit (AMBULATORY_SURGERY_CENTER): Payer: Self-pay | Admitting: *Deleted

## 2020-11-26 VITALS — Ht 65.0 in | Wt 186.2 lb

## 2020-11-26 DIAGNOSIS — Z86010 Personal history of colonic polyps: Secondary | ICD-10-CM

## 2020-11-26 MED ORDER — SUPREP BOWEL PREP KIT 17.5-3.13-1.6 GM/177ML PO SOLN: 1.0000 | Freq: Once | ORAL | 0 refills | Status: AC

## 2020-11-26 NOTE — Progress Notes (Signed)
covid vaccines x3  No trouble with anesthesia, denies being told they were difficult to intubate, or hx/fam hx of malignant hyperthermia per pt   No egg or soy allergy  No home oxygen use   No medications for weight loss taken  emmi information given  Pt denies constipation issues  Pt informed that we do not do prior authorizations for prep

## 2020-11-29 DIAGNOSIS — J029 Acute pharyngitis, unspecified: Secondary | ICD-10-CM | POA: Diagnosis not present

## 2020-11-29 DIAGNOSIS — J302 Other seasonal allergic rhinitis: Secondary | ICD-10-CM | POA: Diagnosis not present

## 2020-12-01 ENCOUNTER — Encounter: Payer: Medicare PPO | Admitting: Physical Therapy

## 2020-12-02 DIAGNOSIS — Z20822 Contact with and (suspected) exposure to covid-19: Secondary | ICD-10-CM | POA: Diagnosis not present

## 2020-12-06 ENCOUNTER — Telehealth: Payer: Self-pay | Admitting: Internal Medicine

## 2020-12-06 DIAGNOSIS — M25572 Pain in left ankle and joints of left foot: Secondary | ICD-10-CM | POA: Diagnosis not present

## 2020-12-06 DIAGNOSIS — S82852D Displaced trimalleolar fracture of left lower leg, subsequent encounter for closed fracture with routine healing: Secondary | ICD-10-CM | POA: Diagnosis not present

## 2020-12-08 ENCOUNTER — Other Ambulatory Visit: Payer: Self-pay

## 2020-12-08 ENCOUNTER — Encounter: Payer: Self-pay | Admitting: Physical Therapy

## 2020-12-08 ENCOUNTER — Ambulatory Visit: Payer: Medicare PPO | Admitting: Physical Therapy

## 2020-12-08 DIAGNOSIS — M25572 Pain in left ankle and joints of left foot: Secondary | ICD-10-CM | POA: Diagnosis not present

## 2020-12-08 DIAGNOSIS — R209 Unspecified disturbances of skin sensation: Secondary | ICD-10-CM

## 2020-12-08 DIAGNOSIS — M6281 Muscle weakness (generalized): Secondary | ICD-10-CM | POA: Diagnosis not present

## 2020-12-08 DIAGNOSIS — M25672 Stiffness of left ankle, not elsewhere classified: Secondary | ICD-10-CM | POA: Diagnosis not present

## 2020-12-08 NOTE — Therapy (Addendum)
[place], [place], [address] Phone: [phone]   Fax:  [phone]  Physical Therapy Treatment  Patient Details  Name: Belinda Morgan MRN: [id number] Date of Birth: 14-Jun-1951 Referring Provider (PT): Wylene Simmer, MD   Encounter Date: 12/08/2020   PT End of Session - 12/08/20 0955     Visit Number 2    Number of Visits 8    Date for PT Re-Evaluation 01/14/21    Authorization Type [employer] MCR    PT Start Time 1000    PT Stop Time 1045    PT Time Calculation (min) 45 min    Activity Tolerance Patient tolerated treatment well    Behavior During Therapy [facility] for tasks assessed/performed             Past Medical History:  Diagnosis Date   Anemia    Hx of    Arthritis    COVID-19 11/29/2019   [facility] and unable to provide result   GERD (gastroesophageal reflux disease)    Heart murmur    Hyperlipemia    Hypertension    MVP (mitral valve prolapse)    hx of    Past Surgical History:  Procedure Laterality Date   APPENDECTOMY     COLONOSCOPY     laporoscopy     diagnostic   ORIF ANKLE FRACTURE Left 02/19/2020   Procedure: Left ankle trimalleolar open reduction internal fixation;  Surgeon: Wylene Simmer, MD;  Location: [facility];  Service: Orthopedics;  Laterality: Left;    There were no vitals filed for this visit.   Subjective Assessment - 12/08/20 1000     Subjective I've had more trouble walking than I have had since falling. Its the next day that I would really fel it with the exercises. I think I am doing some of them wrong.    Currently in Pain? Yes    Pain Score 1    6-7/10 since last time   Pain Location Foot    Pain Orientation Left    Pain Descriptors / Indicators Sharp;Nagging    Pain Type Chronic pain;Surgical pain    Pain Onset More than a month ago    Pain Frequency Intermittent                                                [facility] Adult PT Treatment/Exercise - 12/08/20 0001       Manual Therapy   Manual Therapy Joint mobilization;Passive ROM;Taping    Joint Mobilization subtalar distraction, inversion, eversion; talocrural distraction and AP/PA mobs    Passive ROM PROM all directions    Kinesiotex Edema      Kinesiotix   Edema posteroinferior to malleolus edema bilaterally on L ankle; kinesiotape cut in ribbons for edema management      Ankle Exercises: Stretches   Gastroc Stretch 3 reps;30 seconds    Gastroc Stretch Limitations runners lunge, no stretch noted with strap in supine; unable to perform soleus runners stretch due to lack of ankle mobility      Ankle Exercises: Supine   T-Band 4way ankle yellow band x10                          PT Education - 12/08/20 0954     Education  Details HEP update    Person(s) Educated Patient    Methods Explanation;Demonstration;Verbal cues;Handout    Comprehension Verbalized understanding;Returned demonstration;Need further instruction              PT Short Term Goals - 11/19/20 1123       PT SHORT TERM GOAL #1   Title Pt will be independent with intial HEP.    Time 3    Period Weeks    Status New    Target Date 12/10/20      PT SHORT TERM GOAL #2   Title PT will go over FOTO results with pt by 3rd visit    Time 3    Period Weeks    Status New    Target Date 12/10/20                PT Long Term Goals - 11/19/20 1123       PT LONG TERM GOAL #1   Title Pt will be able to demonstrate 20 SL calf raises in order to demonstrate proper 5/5 gastroc L MMT    Time 8    Period Weeks    Status New    Target Date 01/14/21      PT LONG TERM GOAL #2   Title Pt will go from a 64% to a 70% FOTO score to show functional improvement    Time 8    Period Weeks    Status New    Target Date 01/14/21      PT LONG TERM GOAL #3   Title Pt will be able to achieve 40 degrees AROM plantarflexion in order to help with ambulation    Time 8     Period Weeks    Status New    Target Date 01/14/21      PT LONG TERM GOAL #4   Title Pt will be able to get to L ankl eversion AROM 10 degrees in order to help with ambulation.    Time 8    Period Weeks    Status New    Target Date 01/14/21      PT LONG TERM GOAL #5   Title Pt will be able to get to 0 degrees of AROM dorsiflexion in order to help with ambulation    Time 8    Period Weeks    Status New    Target Date 01/14/21                        Plan - 12/08/20 0956     Clinical Impression Statement Pt tolerated tx well with no adverse effects. Pt left in less pain and was able to 'walk better' after tx today. Session focused on various talocrural and subtalar joint mobs as well as PROM. Pt stated that she was performing inversoin/eversion exercises wrong so that could have been contributing to her pain. Pt was regressed on exercises from a red band to a yellow band even though pt felt like she could tolerate more. Standing heel raises were taken out and regressed to yellow banded plantarflexion. Manual overpressure inversion/eversion was introduced to help increase PROM in these directoins. Kinesiotaping for edema management was done at the end of session. Continues to benefit from skilled PT in order to help addres ankle mobility deficits and strength deficits in order to help with community ambulation and return to PLOF.    PT Treatment/Interventions ADLs/Self Care Home Management;Moist Heat;Cryotherapy;Ultrasound;Gait training;Stair training;Functional mobility training;Therapeutic activities;Therapeutic exercise;Balance  training;Compression bandaging;Patient/family education;Manual techniques;Passive range of motion;Scar mobilization;Joint Manipulations;Taping    PT Next Visit Plan scar mobilization PRN, progress 3 way ankle, progress calf raises, joint mobilization talocrural and subtalar    PT Home Exercise Plan [email or web address]    Consulted and Agree with Plan of Care Patient              Patient will benefit from skilled therapeutic intervention in order to improve the following deficits and impairments:  Abnormal gait,Difficulty walking,Pain,Impaired sensation,Decreased strength,Decreased mobility,Hypomobility,Decreased scar mobility,Decreased activity tolerance,Impaired flexibility,Decreased balance  Visit Diagnosis: Stiffness of left ankle, not elsewhere classified  Muscle weakness (generalized)  Pain in left ankle and joints of left foot  Unspecified disturbances of skin sensation      Problem List Patient Active Problem List   Diagnosis Date Noted   Blood in urine 08/28/2017   Hemorrhage of rectum and anus 06/21/2011    Caleb Popp, SPT 12/08/2020, 6:22 PM  [facility] [address] [place], [place], [address] Phone: [phone]   Fax:  [phone]  Name: Belinda Morgan MRN: [id number] Date of Birth: 1951/01/06

## 2020-12-09 NOTE — Patient Instructions (Signed)
Access Code: [email or web address] URL: [email or web address] Date: 12/09/2020 Prepared by: Hilda Blades  Exercises Long Sitting Ankle Plantar Flexion with Resistance - 1 x daily - 7 x weekly - 2 sets - 10 reps Ankle Dorsiflexion with Resistance - 1 x daily - 7 x weekly - 2 sets - 10 reps Long Sitting Ankle Inversion with Resistance - 1 x daily - 7 x weekly - 2 sets - 10 reps Long Sitting Ankle Eversion with Resistance - 1 x daily - 7 x weekly - 2 sets - 10 reps Seated Ankle Inversion Eversion PROM - 1 x daily - 7 x weekly - 3 reps - 10-15 seconds hold Gastroc Stretch on Wall - 1 x daily - 7 x weekly - 2 sets - 30 hold

## 2020-12-10 ENCOUNTER — Ambulatory Visit (AMBULATORY_SURGERY_CENTER): Payer: Medicare PPO | Admitting: Internal Medicine

## 2020-12-10 ENCOUNTER — Encounter: Payer: Self-pay | Admitting: Internal Medicine

## 2020-12-10 ENCOUNTER — Other Ambulatory Visit: Payer: Self-pay

## 2020-12-10 VITALS — BP 117/74 | HR 58 | Temp 98.0°F | Resp 14 | Ht 65.0 in | Wt 186.2 lb

## 2020-12-10 DIAGNOSIS — Z1211 Encounter for screening for malignant neoplasm of colon: Secondary | ICD-10-CM | POA: Diagnosis not present

## 2020-12-10 DIAGNOSIS — K635 Polyp of colon: Secondary | ICD-10-CM | POA: Diagnosis not present

## 2020-12-10 DIAGNOSIS — Z86010 Personal history of colonic polyps: Secondary | ICD-10-CM

## 2020-12-10 DIAGNOSIS — D125 Benign neoplasm of sigmoid colon: Secondary | ICD-10-CM

## 2020-12-10 MED ORDER — SODIUM CHLORIDE 0.9 % IV SOLN: 500.0000 mL | Freq: Once | INTRAVENOUS | Status: DC

## 2020-12-10 NOTE — Progress Notes (Signed)
Called to room to assist during endoscopic procedure.  Patient ID and intended procedure confirmed with present staff. Received instructions for my participation in the procedure from the performing physician.  

## 2020-12-10 NOTE — Progress Notes (Signed)
A and O x3. Report to RN. Tolerated MAC anesthesia well.

## 2020-12-10 NOTE — Patient Instructions (Signed)
Handout on polyps, diverticulosis and hemorrhoids given. ? ?YOU HAD AN ENDOSCOPIC PROCEDURE TODAY AT THE [facility]:   Refer to the procedure report that was given to you for any specific questions about what was found during the examination.  If the procedure report does not answer your questions, please call your gastroenterologist to clarify.  If you requested that your care partner not be given the details of your procedure findings, then the procedure report has been included in a sealed envelope for you to review at your convenience later. ? ?YOU SHOULD EXPECT: Some feelings of bloating in the abdomen. Passage of more gas than usual.  Walking can help get rid of the air that was put into your GI tract during the procedure and reduce the bloating. If you had a lower endoscopy (such as a colonoscopy or flexible sigmoidoscopy) you may notice spotting of blood in your stool or on the toilet paper. If you underwent a bowel prep for your procedure, you may not have a normal bowel movement for a few days. ? ?Please Note:  You might notice some irritation and congestion in your nose or some drainage.  This is from the oxygen used during your procedure.  There is no need for concern and it should clear up in a day or so. ? ?SYMPTOMS TO REPORT IMMEDIATELY: ? ?Following lower endoscopy (colonoscopy or flexible sigmoidoscopy): ? Excessive amounts of blood in the stool ? Significant tenderness or worsening of abdominal pains ? Swelling of the abdomen that is new, acute ? Fever of 100?F or higher ? ? ?For urgent or emergent issues, a gastroenterologist can be reached at any hour by calling [phone]. ?Do not use MyChart messaging for urgent concerns.  ? ? ?DIET:  We do recommend a small meal at first, but then you may proceed to your regular diet.  Drink plenty of fluids but you should avoid alcoholic beverages for 24 hours. ? ?ACTIVITY:  You should plan to take it easy for the rest of today and you  should NOT DRIVE or use heavy machinery until tomorrow (because of the sedation medicines used during the test).   ? ?FOLLOW UP: ?Our staff will call the number listed on your records 48-72 hours following your procedure to check on you and address any questions or concerns that you may have regarding the information given to you following your procedure. If we do not reach you, we will leave a message.  We will attempt to reach you two times.  During this call, we will ask if you have developed any symptoms of COVID 19. If you develop any symptoms (ie: fever, flu-like symptoms, shortness of breath, cough etc.) before then, please call [phone].  If you test positive for Covid 19 in the 2 weeks post procedure, please call and report this information to us.   ? ?If any biopsies were taken you will be contacted by phone or by letter within the next 1-3 weeks.  Please call us at [phone] if you have not heard about the biopsies in 3 weeks.  ? ? ?SIGNATURES/CONFIDENTIALITY: ?You and/or your care partner have signed paperwork which will be entered into your electronic medical record.  These signatures attest to the fact that that the information above on your After Visit Summary has been reviewed and is understood.  Full responsibility of the confidentiality of this discharge information lies with you and/or your care-partner.  ?

## 2020-12-10 NOTE — Progress Notes (Signed)
VS by CW  Pt's states no medical or surgical changes since previsit or office visit.  

## 2020-12-10 NOTE — Op Note (Signed)
[place] Patient Name: Belinda Morgan Procedure Date: 12/10/2020 7:54 AM MRN: [id number] Endoscopist: Jerene Bears , MD Age: 70 Referring MD:  Date of Birth: March 12, 1951 Gender: Female Account #: [id number] Procedure:                Colonoscopy Indications:              High risk colon cancer surveillance: Personal                            history of sessile serrated colon polyp (less than                            10 mm in size) with no dysplasia and tubular                            adenomas, Last colonoscopy: January 2019 Medicines:                Monitored Anesthesia Care Procedure:                Pre-Anesthesia Assessment:                           - Prior to the procedure, a History and Physical                            was performed, and patient medications and                            allergies were reviewed. The patient's tolerance of                            previous anesthesia was also reviewed. The risks                            and benefits of the procedure and the sedation                            options and risks were discussed with the patient.                            All questions were answered, and informed consent                            was obtained. Prior Anticoagulants: The patient has                            taken no previous anticoagulant or antiplatelet                            agents. ASA Grade Assessment: II - A patient with                            mild systemic disease. After reviewing the risks  and benefits, the patient was deemed in                            satisfactory condition to undergo the procedure.                           After obtaining informed consent, the colonoscope                            was passed under direct vision. Throughout the                            procedure, the patient's blood pressure, pulse, and                            oxygen saturations were  monitored continuously. The                            Olympus PCF-H190DL (#[id number]) Colonoscope was                            introduced through the anus and advanced to the                            cecum, identified by appendiceal orifice and                            ileocecal valve. The colonoscopy was performed                            without difficulty. The patient tolerated the                            procedure well. The quality of the bowel                            preparation was good. The ileocecal valve,                            appendiceal orifice, and rectum were photographed. Scope In: 8:10:08 AM Scope Out: 8:28:16 AM Scope Withdrawal Time: 0 hours 13 minutes 34 seconds  Total Procedure Duration: 0 hours 18 minutes 8 seconds  Findings:                 Hemorrhoids were found on perianal exam.                           Two sessile polyps were found in the sigmoid colon.                            The polyps were 4 to 5 mm in size. These polyps                            were removed with a cold snare. Resection and  retrieval were complete.                           Multiple small and large-mouthed diverticula were                            found in the sigmoid colon, descending colon,                            transverse colon and hepatic flexure.                           Internal hemorrhoids were found during retroflexion                            and during digital exam. The hemorrhoids were Grade                            III (internal hemorrhoids that prolapse but require                            manual reduction). Complications:            No immediate complications. Estimated Blood Loss:     Estimated blood loss was minimal. Impression:               - Two 4 to 5 mm polyps in the sigmoid colon,                            removed with a cold snare. Resected and retrieved.                           - Diverticulosis in the  sigmoid colon, in the                            descending colon, in the transverse colon and at                            the hepatic flexure.                           - Internal hemorrhoids. Recommendation:           - Patient has a contact number available for                            emergencies. The signs and symptoms of potential                            delayed complications were discussed with the                            patient. Return to normal activities tomorrow.                            Written discharge instructions were provided to  the                            patient.                           - Resume previous diet.                           - Continue present medications.                           - Await pathology results.                           - Repeat colonoscopy is recommended for                            surveillance. The colonoscopy date will be                            determined after pathology results from today's                            exam become available for review. Jerene Bears, MD 12/10/2020 8:34:51 AM This report has been signed electronically.

## 2020-12-13 ENCOUNTER — Other Ambulatory Visit: Payer: Self-pay

## 2020-12-13 ENCOUNTER — Encounter: Payer: Self-pay | Admitting: Physical Therapy

## 2020-12-13 ENCOUNTER — Ambulatory Visit: Payer: Medicare PPO | Attending: Orthopedic Surgery | Admitting: Physical Therapy

## 2020-12-13 DIAGNOSIS — M25572 Pain in left ankle and joints of left foot: Secondary | ICD-10-CM | POA: Insufficient documentation

## 2020-12-13 DIAGNOSIS — M6281 Muscle weakness (generalized): Secondary | ICD-10-CM | POA: Diagnosis not present

## 2020-12-13 DIAGNOSIS — R209 Unspecified disturbances of skin sensation: Secondary | ICD-10-CM | POA: Diagnosis not present

## 2020-12-13 DIAGNOSIS — M25672 Stiffness of left ankle, not elsewhere classified: Secondary | ICD-10-CM | POA: Diagnosis not present

## 2020-12-13 NOTE — Therapy (Signed)
[place], [place], [address] Phone: [phone]   Fax:  [phone]  Physical Therapy Treatment  Patient Details  Name: Belinda Morgan MRN: [id number] Date of Birth: Jul 13, 1951 Referring Provider (PT): Wylene Simmer, MD   Encounter Date: 12/13/2020   PT End of Session - 12/13/20 1500    Visit Number 3    Number of Visits 8    Date for PT Re-Evaluation 01/14/21    Authorization Type [employer] MCR    PT Start Time 2620    PT Stop Time 1535    PT Time Calculation (min) 48 min    Activity Tolerance Patient tolerated treatment well    Behavior During Therapy [facility] for tasks assessed/performed           Past Medical History:  Diagnosis Date  . Allergy   . Anemia    Hx of   . Arthritis   . COVID-19 11/29/2019   tested [facility], and unable to provide result  . GERD (gastroesophageal reflux disease)   . Heart murmur   . Hyperlipemia   . Hypertension   . MVP (mitral valve prolapse)    hx of    Past Surgical History:  Procedure Laterality Date  . APPENDECTOMY    . COLONOSCOPY    . laporoscopy     diagnostic  . ORIF ANKLE FRACTURE Left 02/19/2020   Procedure: Left ankle trimalleolar open reduction internal fixation;  Surgeon: Wylene Simmer, MD;  Location: [place];  Service: Orthopedics;  Laterality: Left;    There were no vitals filed for this visit.   Subjective Assessment - 12/13/20 1456    Subjective Been out in the yard.  I have a hill in my garden andI'm not completely comfortable. Asks about technique for HEP.    Currently in Pain? Yes    Pain Score 0-No pain             [facility] Adult PT Treatment/Exercise - 12/13/20 0001      Manual Therapy   Joint Mobilization A/P mobs, metatarsal mobs    Passive ROM PROM all directions    Kinesiotex Edema      Kinesiotix   Edema 2 fans, 1 along anterior aspect of L lower leg (ant tib) and then more medial based on swelling.  II  strip across anterior ankle transversely with 50% stretch midway to create space      Ankle Exercises: Aerobic   Nustep 6 min LE only L5      Ankle Exercises: Seated   Ankle Circles/Pumps AROM;Left;10 reps   circles red band each direction   Toe Raise 15 reps    Other Seated Ankle Exercises ankle T band inversion and eversion x 20 , cues for set up and asst with  technique   yellow                 PT Education - 12/13/20 1644    Education Details changed theraband set up to seated for ease    Person(s) Educated Patient    Methods Explanation;Handout    Comprehension Verbalized understanding;Returned demonstration            PT Short Term Goals - 12/13/20 1645      PT SHORT TERM GOAL #1   Title Pt will be independent with intial HEP.    Status On-going      PT SHORT TERM GOAL #2   Title PT will go over  FOTO results with pt by 3rd visit    Status Unable to assess             PT Long Term Goals - 11/19/20 1123      PT LONG TERM GOAL #1   Title Pt will be able to demonstrate 20 SL calf raises in order to demonstrate proper 5/5 gastroc L MMT    Time 8    Period Weeks    Status New    Target Date 01/14/21      PT LONG TERM GOAL #2   Title Pt will go from a 64% to a 70% FOTO score to show functional improvement    Time 8    Period Weeks    Status New    Target Date 01/14/21      PT LONG TERM GOAL #3   Title Pt will be able to achieve 40 degrees AROM plantarflexion in order to help with ambulation    Time 8    Period Weeks    Status New    Target Date 01/14/21      PT LONG TERM GOAL #4   Title Pt will be able to get to L ankl eversion AROM 10 degrees in order to help with ambulation.    Time 8    Period Weeks    Status New    Target Date 01/14/21      PT LONG TERM GOAL #5   Title Pt will be able to get to 0 degrees of AROM dorsiflexion in order to help with ambulation    Time 8    Period Weeks    Status New    Target Date 01/14/21                  Plan - 12/13/20 1508    Clinical Impression Statement Pt continues to struggle somewhat with the setup of the theraband.  Made it easier with seated variation. Noted her foot felt more mobile ad without pain as she left the clinic.    PT Treatment/Interventions ADLs/Self Care Home Management;Moist Heat;Cryotherapy;Ultrasound;Gait training;Stair training;Functional mobility training;Therapeutic activities;Therapeutic exercise;Balance training;Compression bandaging;Patient/family education;Manual techniques;Passive range of motion;Scar mobilization;Joint Manipulations;Taping    PT Next Visit Plan FOTO interpretation  move to standing ASAP.  manual, scar tissue and tape if needed    PT Home Exercise Plan [email or web address]    Consulted and Agree with Plan of Care Patient           Patient will benefit from skilled therapeutic intervention in order to improve the following deficits and impairments:  Abnormal gait,Difficulty walking,Pain,Impaired sensation,Decreased strength,Decreased mobility,Hypomobility,Decreased scar mobility,Decreased activity tolerance,Impaired flexibility,Decreased balance  Visit Diagnosis: Stiffness of left ankle, not elsewhere classified  Muscle weakness (generalized)  Pain in left ankle and joints of left foot  Unspecified disturbances of skin sensation     Problem List Patient Active Problem List   Diagnosis Date Noted  . Blood in urine 08/28/2017  . Hemorrhage of rectum and anus 06/21/2011    [name] 12/13/2020, 4:53 PM  [place], [place], [address] Phone: [phone]   Fax:  [phone]  Name: Belinda Morgan MRN: [id number] Date of Birth: 1951-08-02  Raeford Razor, PT 12/13/20 4:53 PM Phone: [phone] Fax: [phone]

## 2020-12-14 ENCOUNTER — Telehealth: Payer: Self-pay | Admitting: *Deleted

## 2020-12-14 NOTE — Telephone Encounter (Signed)
  Follow up Call-  Call back number 12/10/2020  Post procedure Call Back phone  # [phone]  Permission to leave phone message Yes  Some recent data might be hidden     Patient questions:  Do you have a fever, pain , or abdominal swelling? No. Pain Score  0 *  Have you tolerated food without any problems? Yes.    Have you been able to return to your normal activities? Yes.    Do you have any questions about your discharge instructions: Diet   No. Medications  No. Follow up visit  No.  Do you have questions or concerns about your Care? No.  Actions: * If pain score is 4 or above: No action needed, pain <4.  1. Have you developed a fever since your procedure? no  2.   Have you had an respiratory symptoms (SOB or cough) since your procedure? no  3.   Have you tested positive for COVID 19 since your procedure no  4.   Have you had any family members/close contacts diagnosed with the COVID 19 since your procedure?  no   If yes to any of these questions please route to Joylene John, RN and Joella Prince, RN

## 2020-12-15 ENCOUNTER — Ambulatory Visit: Payer: Medicare PPO | Admitting: Physical Therapy

## 2020-12-15 ENCOUNTER — Other Ambulatory Visit: Payer: Self-pay

## 2020-12-15 ENCOUNTER — Encounter: Payer: Self-pay | Admitting: Physical Therapy

## 2020-12-15 VITALS — BP 118/75

## 2020-12-15 DIAGNOSIS — M25572 Pain in left ankle and joints of left foot: Secondary | ICD-10-CM

## 2020-12-15 DIAGNOSIS — R209 Unspecified disturbances of skin sensation: Secondary | ICD-10-CM | POA: Diagnosis not present

## 2020-12-15 DIAGNOSIS — M25672 Stiffness of left ankle, not elsewhere classified: Secondary | ICD-10-CM | POA: Diagnosis not present

## 2020-12-15 DIAGNOSIS — M6281 Muscle weakness (generalized): Secondary | ICD-10-CM | POA: Diagnosis not present

## 2020-12-15 NOTE — Patient Instructions (Signed)
Access Code: [email or web address] URL: [email or web address] Date: 12/15/2020 Prepared by: Hilda Blades  Exercises Long Sitting Ankle Plantar Flexion with Resistance - 1 x daily - 7 x weekly - 2 sets - 10 reps Ankle Dorsiflexion with Resistance - 1 x daily - 7 x weekly - 2 sets - 10 reps Seated Ankle Inversion Eversion PROM - 1 x daily - 7 x weekly - 3 reps - 10-15 seconds hold Gastroc Stretch on Wall - 1 x daily - 7 x weekly - 2 sets - 30 hold Seated Figure 4 Ankle Inversion with Resistance - 1 x daily - 7 x weekly - 2 sets - 10 reps - 5 hold Seated Ankle Eversion with Resistance - 1 x daily - 7 x weekly - 2 sets - 10 reps - 5 hold Seated Heel Toe Raises - 1 x daily - 7 x weekly - 2 sets - 10 reps - 5 hold

## 2020-12-15 NOTE — Therapy (Addendum)
[place], [place], [address] Phone: [phone]   Fax:  [phone]  Physical Therapy Treatment  Patient Details  Name: Belinda Morgan MRN: [id number] Date of Birth: October 21, 1950 Referring Provider (PT): Wylene Simmer, MD   Encounter Date: 12/15/2020   PT End of Session - 12/15/20 1043     Visit Number 4    Number of Visits 8    Date for PT Re-Evaluation 01/14/21    Authorization Type [employer] MCR    PT Start Time 0915    PT Stop Time 1004    PT Time Calculation (min) 49 min    Activity Tolerance Patient tolerated treatment well    Behavior During Therapy [facility] for tasks assessed/performed             Past Medical History:  Diagnosis Date   Allergy    Anemia    Hx of    Arthritis    COVID-19 11/29/2019   [facility], and unable to provide result   GERD (gastroesophageal reflux disease)    Heart murmur    Hyperlipemia    Hypertension    MVP (mitral valve prolapse)    hx of    Past Surgical History:  Procedure Laterality Date   APPENDECTOMY     COLONOSCOPY     laporoscopy     diagnostic   ORIF ANKLE FRACTURE Left 02/19/2020   Procedure: Left ankle trimalleolar open reduction internal fixation;  Surgeon: Wylene Simmer, MD;  Location: [place];  Service: Orthopedics;  Laterality: Left;    Vitals:   12/15/20 0946  BP: 118/75     Subjective Assessment - 12/15/20 0921     Subjective The band exercises are easier now. I felt good after last session.    Currently in Pain? No/denies                                               [facility] Adult PT Treatment/Exercise - 12/15/20 0001       Manual Therapy   Manual Therapy Joint mobilization;Passive ROM;Taping    Joint Mobilization subtalar distraction, inversion, eversion; talocrural distraction and AP/PA mobs    Passive ROM PROM all directions    Kinesiotex Edema      Kinesiotix   Edema 2 fans, 1  along anterior aspect of L lower leg (ant tib) and then more medial based on swelling.  II strip across anterior ankle transversely with 50% stretch midway to create space      Ankle Exercises: Aerobic   Nustep 5 min LE only L5      Ankle Exercises: Seated   Toe Raise --   seated heel toe raises x10   Other Seated Ankle Exercises ankle T band 4 way x15-20 yellow    Other Seated Ankle Exercises inversion/eversion 2x30" self stretch with hands                          PT Education - 12/15/20 1224     Education Details continue with HEP    Person(s) Educated Patient    Methods Explanation;Handout;Demonstration    Comprehension Verbalized understanding;Returned demonstration;Need further instruction              PT Short Term Goals - 12/13/20 1645  PT SHORT TERM GOAL #1   Title Pt will be independent with intial HEP.    Status On-going      PT SHORT TERM GOAL #2   Title PT will go over FOTO results with pt by 3rd visit    Status Unable to assess                PT Long Term Goals - 11/19/20 1123       PT LONG TERM GOAL #1   Title Pt will be able to demonstrate 20 SL calf raises in order to demonstrate proper 5/5 gastroc L MMT    Time 8    Period Weeks    Status New    Target Date 01/14/21      PT LONG TERM GOAL #2   Title Pt will go from a 64% to a 70% FOTO score to show functional improvement    Time 8    Period Weeks    Status New    Target Date 01/14/21      PT LONG TERM GOAL #3   Title Pt will be able to achieve 40 degrees AROM plantarflexion in order to help with ambulation    Time 8    Period Weeks    Status New    Target Date 01/14/21      PT LONG TERM GOAL #4   Title Pt will be able to get to L ankl eversion AROM 10 degrees in order to help with ambulation.    Time 8    Period Weeks    Status New    Target Date 01/14/21      PT LONG TERM GOAL #5   Title Pt will be able to get to 0 degrees of AROM dorsiflexion in order to  help with ambulation    Time 8    Period Weeks    Status New    Target Date 01/14/21                        Plan - 12/15/20 1225     Clinical Impression Statement Pt tolerate tx well with no adverse effects. Seated theraband exercises continued this session. Manual therapy done to increase ankle/foot ROM and kinesiotape to help with edema management. Pt shown how to use medbridge videos to help facilitate independence with exercsies. Continues to benefit from skilled PT in order to increase ankle/foot ROM and strength, as well as edema management in order to increase functional mobility, community ambulation, and return to PLOF.    PT Treatment/Interventions ADLs/Self Care Home Management;Moist Heat;Cryotherapy;Ultrasound;Gait training;Stair training;Functional mobility training;Therapeutic activities;Therapeutic exercise;Balance training;Compression bandaging;Patient/family education;Manual techniques;Passive range of motion;Scar mobilization;Joint Manipulations;Taping    PT Next Visit Plan FOTO interpretation, move to standing ASAP.  manual, scar tissue and tape if needed    PT Home Exercise Plan [email or web address]    Consulted and Agree with Plan of Care Patient             Patient will benefit from skilled therapeutic intervention in order to improve the following deficits and impairments:  Abnormal gait,Difficulty walking,Pain,Impaired sensation,Decreased strength,Decreased mobility,Hypomobility,Decreased scar mobility,Decreased activity tolerance,Impaired flexibility,Decreased balance  Visit Diagnosis: Stiffness of left ankle, not elsewhere classified  Muscle weakness (generalized)  Pain in left ankle and joints of left foot  Unspecified disturbances of skin sensation      Problem List Patient Active Problem List   Diagnosis Date Noted   Blood in urine 08/28/2017   Hemorrhage of  rectum and anus 06/21/2011    Caleb Popp, SPT 12/15/2020, 1:58 PM  [facility] [address] [place], [place], [address] Phone: [phone]   Fax:  [phone]  Name: CHASTIN GARLITZ MRN: [id number] Date of Birth: Oct 26, 1950

## 2020-12-16 DIAGNOSIS — S82202A Unspecified fracture of shaft of left tibia, initial encounter for closed fracture: Secondary | ICD-10-CM | POA: Diagnosis not present

## 2020-12-16 DIAGNOSIS — S82402A Unspecified fracture of shaft of left fibula, initial encounter for closed fracture: Secondary | ICD-10-CM | POA: Diagnosis not present

## 2020-12-20 ENCOUNTER — Encounter: Payer: Self-pay | Admitting: Internal Medicine

## 2020-12-20 DIAGNOSIS — M19171 Post-traumatic osteoarthritis, right ankle and foot: Secondary | ICD-10-CM | POA: Diagnosis not present

## 2020-12-20 DIAGNOSIS — Z4789 Encounter for other orthopedic aftercare: Secondary | ICD-10-CM | POA: Diagnosis not present

## 2020-12-20 DIAGNOSIS — G629 Polyneuropathy, unspecified: Secondary | ICD-10-CM | POA: Diagnosis not present

## 2020-12-22 ENCOUNTER — Ambulatory Visit: Payer: Medicare PPO | Admitting: Physical Therapy

## 2020-12-27 ENCOUNTER — Encounter: Payer: Self-pay | Admitting: Physical Therapy

## 2020-12-27 ENCOUNTER — Ambulatory Visit: Payer: Medicare PPO | Admitting: Physical Therapy

## 2020-12-27 ENCOUNTER — Other Ambulatory Visit: Payer: Self-pay

## 2020-12-27 DIAGNOSIS — M25672 Stiffness of left ankle, not elsewhere classified: Secondary | ICD-10-CM

## 2020-12-27 DIAGNOSIS — R209 Unspecified disturbances of skin sensation: Secondary | ICD-10-CM | POA: Diagnosis not present

## 2020-12-27 DIAGNOSIS — M6281 Muscle weakness (generalized): Secondary | ICD-10-CM | POA: Diagnosis not present

## 2020-12-27 DIAGNOSIS — M25572 Pain in left ankle and joints of left foot: Secondary | ICD-10-CM

## 2020-12-27 NOTE — Therapy (Addendum)
[place], [place], [address] Phone: [phone]   Fax:  [phone]  Physical Therapy Treatment  Patient Details  Name: Belinda Morgan MRN: [id number] Date of Birth: [birth year] Referring Provider (PT): Wylene Simmer, MD   Encounter Date: 12/27/2020   PT End of Session - 12/27/20 1004     Visit Number 5    Number of Visits 8    Date for PT Re-Evaluation 01/14/21    Authorization Type [employer] MCR    Authorization Time Period 11/19/20 - 01/29/21    Authorization - Visit Number 5    Authorization - Number of Visits 8    PT Start Time 6270    PT Stop Time 1102   10 minutes vaso not billed   PT Time Calculation (min) 60 min    Activity Tolerance Patient tolerated treatment well    Behavior During Therapy [facility] for tasks assessed/performed             Past Medical History:  Diagnosis Date   Allergy    Anemia    Hx of    Arthritis    COVID-19 11/29/2019   [facility], and unable to provide result   GERD (gastroesophageal reflux disease)    Heart murmur    Hyperlipemia    Hypertension    MVP (mitral valve prolapse)    hx of    Past Surgical History:  Procedure Laterality Date   APPENDECTOMY     COLONOSCOPY     laporoscopy     diagnostic   ORIF ANKLE FRACTURE Left 02/19/2020   Procedure: Left ankle trimalleolar open reduction internal fixation;  Surgeon: Wylene Simmer, MD;  Location: [place];  Service: Orthopedics;  Laterality: Left;    There were no vitals filed for this visit.   Subjective Assessment - 12/27/20 1005     Subjective I wasn't able to make last appt because I had to take my husband to his appt. Last week was a little crazy so i wasn't able to keep up with the exercises as well. Orthopedist said that to expect nerve damage but it might decrease. He did schedule me with a neurologist though.    Currently in Pain? No/denies                                                [facility] Adult PT Treatment/Exercise - 12/27/20 0001       Self-Care   Self-Care RICE;Other Self-Care Comments    RICE when to do RICE, even doing it for prevention rather than just management    Other Self-Care Comments  nerve growth regeneration education      Modalities   Modalities Vasopneumatic      Vasopneumatic   Number Minutes Vasopneumatic  10 minutes    Vasopnuematic Location  Ankle    Vasopneumatic Pressure Medium    Vasopneumatic Temperature  34      Manual Therapy   Manual Therapy Joint mobilization;Passive ROM;Taping    Joint Mobilization subtalar distraction, inversion, eversion; talocrural distraction and AP/PA mobs    Passive ROM PROM all directions    Kinesiotex Edema      Kinesiotix   Edema 2 fans, one on medial aspect of leg going to anterior ankle and medial malleolus area, one on lateral aspectof leg going to  lateral malleolus and anterior ankle      Ankle Exercises: Seated   Other Seated Ankle Exercises ankle T band 4 way 2x10 red    Other Seated Ankle Exercises inversion/eversion 2x30" self stretch with hands      Ankle Exercises: Aerobic   Nustep 5 min LE only L5      Ankle Exercises: Standing   Other Standing Ankle Exercises tandem only L foot in back 3x30"                          PT Education - 12/27/20 1059     Education Details HEP update, see self care    Person(s) Educated Patient    Methods Explanation;Demonstration;Handout;Tactile cues    Comprehension Verbalized understanding;Returned demonstration;Need further instruction              PT Short Term Goals - 12/27/20 1116       PT SHORT TERM GOAL #1   Title Pt will be independent with intial HEP.    Status Achieved      PT SHORT TERM GOAL #2   Title PT will go over FOTO results with pt by 3rd visit    Status Unable to assess                PT Long Term Goals - 11/19/20 1123       PT LONG TERM GOAL #1   Title Pt will be  able to demonstrate 20 SL calf raises in order to demonstrate proper 5/5 gastroc L MMT    Time 8    Period Weeks    Status New    Target Date 01/14/21      PT LONG TERM GOAL #2   Title Pt will go from a 64% to a 70% FOTO score to show functional improvement    Time 8    Period Weeks    Status New    Target Date 01/14/21      PT LONG TERM GOAL #3   Title Pt will be able to achieve 40 degrees AROM plantarflexion in order to help with ambulation    Time 8    Period Weeks    Status New    Target Date 01/14/21      PT LONG TERM GOAL #4   Title Pt will be able to get to L ankl eversion AROM 10 degrees in order to help with ambulation.    Time 8    Period Weeks    Status New    Target Date 01/14/21      PT LONG TERM GOAL #5   Title Pt will be able to get to 0 degrees of AROM dorsiflexion in order to help with ambulation    Time 8    Period Weeks    Status New    Target Date 01/14/21                        Plan - 12/27/20 1021     Clinical Impression Statement Pt tolerated tx well with no adverse effects. Seated theraband exercises progressed back to red band in all directions. Manual PT done to increase ankle/foot ROm as well as help with edema management. Tandem introduced this session to work on CKC stabilization of the ankle. Vasopneumatic done to prevent edema also. Continues to benefit from skilled PT in order to increase ankle ROM and strength, CKC stabilization/proprioception, and decrease edema  in order to further increase funcitonal mobility, community ambulation,a nd return to PLOF.    PT Treatment/Interventions ADLs/Self Care Home Management;Moist Heat;Cryotherapy;Ultrasound;Gait training;Stair training;Functional mobility training;Therapeutic activities;Therapeutic exercise;Balance training;Compression bandaging;Patient/family education;Manual techniques;Passive range of motion;Scar mobilization;Joint Manipulations;Taping    PT Next Visit Plan standing balance,  manual, scar tissue and tape PRN, 4 way ankle, gastroc stretching    PT Home Exercise Plan [email or web address]    Consulted and Agree with Plan of Care Patient             Patient will benefit from skilled therapeutic intervention in order to improve the following deficits and impairments:  Abnormal gait,Difficulty walking,Pain,Impaired sensation,Decreased strength,Decreased mobility,Hypomobility,Decreased scar mobility,Decreased activity tolerance,Impaired flexibility,Decreased balance  Visit Diagnosis: Stiffness of left ankle, not elsewhere classified  Muscle weakness (generalized)  Pain in left ankle and joints of left foot  Unspecified disturbances of skin sensation      Problem List Patient Active Problem List   Diagnosis Date Noted   Blood in urine 08/28/2017   Hemorrhage of rectum and anus 06/21/2011    Caleb Popp, SPT 12/27/2020, 11:59 AM  [place], [place], [address] Phone: [phone]   Fax:  [phone]  Name: MARIETTA SIKKEMA MRN: [id number] Date of Birth: [birth year]

## 2020-12-27 NOTE — Patient Instructions (Signed)
Access Code: [email or web address] URL: [email or web address] Date: 12/27/2020 Prepared by: Caleb Popp  Exercises Long Sitting Ankle Plantar Flexion with Resistance - 1 x daily - 7 x weekly - 2 sets - 10 reps Ankle Dorsiflexion with Resistance - 1 x daily - 7 x weekly - 2 sets - 10 reps Seated Ankle Inversion Eversion PROM - 1 x daily - 7 x weekly - 3 reps - 10-15 seconds hold Gastroc Stretch on Wall - 1 x daily - 7 x weekly - 2 sets - 30 hold Seated Figure 4 Ankle Inversion with Resistance - 1 x daily - 7 x weekly - 2 sets - 10 reps - 5 hold Seated Ankle Eversion with Resistance - 1 x daily - 7 x weekly - 2 sets - 10 reps - 5 hold Seated Heel Toe Raises - 1 x daily - 7 x weekly - 2 sets - 10 reps - 5 hold Standing Tandem Balance with Counter Support - 1 x daily - 7 x weekly - 3 sets - 30 seconds hold

## 2021-01-05 ENCOUNTER — Ambulatory Visit: Payer: Medicare PPO | Admitting: Physical Therapy

## 2021-01-05 ENCOUNTER — Other Ambulatory Visit: Payer: Self-pay

## 2021-01-05 ENCOUNTER — Encounter: Payer: Self-pay | Admitting: Physical Therapy

## 2021-01-05 DIAGNOSIS — R209 Unspecified disturbances of skin sensation: Secondary | ICD-10-CM

## 2021-01-05 DIAGNOSIS — M6281 Muscle weakness (generalized): Secondary | ICD-10-CM

## 2021-01-05 DIAGNOSIS — M25572 Pain in left ankle and joints of left foot: Secondary | ICD-10-CM

## 2021-01-05 DIAGNOSIS — M25672 Stiffness of left ankle, not elsewhere classified: Secondary | ICD-10-CM

## 2021-01-05 NOTE — Patient Instructions (Signed)
Access Code: [email or web address] URL: [email or web address] Date: 01/05/2021 Prepared by: Caleb Popp  Exercises Long Sitting Ankle Plantar Flexion with Resistance - 1 x daily - 7 x weekly - 2 sets - 10 reps Seated Ankle Inversion Eversion PROM - 1 x daily - 7 x weekly - 3 reps - 10-15 seconds hold Gastroc Stretch on Wall - 1 x daily - 7 x weekly - 2 sets - 30 hold Seated Figure 4 Ankle Inversion with Resistance - 1 x daily - 7 x weekly - 2 sets - 10 reps - 5 hold Seated Ankle Eversion with Resistance - 1 x daily - 7 x weekly - 2 sets - 10 reps - 5 hold Seated Heel Toe Raises - 1 x daily - 7 x weekly - 2 sets - 10 reps - 5 hold Standing Tandem Balance with Counter Support - 1 x daily - 7 x weekly - 3 sets - 30 seconds hold Seated Ankle Dorsiflexion with Resistance - 1 x daily - 7 x weekly - 3 sets - 10 reps

## 2021-01-05 NOTE — Therapy (Addendum)
[place], [place], [address] Phone: [phone]   Fax:  [phone]  Physical Therapy Treatment  Patient Details  Name: Belinda Morgan MRN: [id number] Date of Birth: 1951/07/18 Referring Provider (PT): Wylene Simmer, MD   Encounter Date: 01/05/2021   PT End of Session - 01/05/21 0836     Visit Number 6    Number of Visits 8    Date for PT Re-Evaluation 01/14/21    Authorization Type [employer] MCR    Authorization Time Period 11/19/20 - 01/29/21    Authorization - Visit Number 6    Authorization - Number of Visits 8    PT Start Time 7253    PT Stop Time 0919    PT Time Calculation (min) 45 min    Activity Tolerance Patient tolerated treatment well    Behavior During Therapy [facility] for tasks assessed/performed             Past Medical History:  Diagnosis Date   Allergy    Anemia    Hx of    Arthritis    COVID-19 11/29/2019   [facility] and unable to provide result   GERD (gastroesophageal reflux disease)    Heart murmur    Hyperlipemia    Hypertension    MVP (mitral valve prolapse)    hx of    Past Surgical History:  Procedure Laterality Date   APPENDECTOMY     COLONOSCOPY     laporoscopy     diagnostic   ORIF ANKLE FRACTURE Left 02/19/2020   Procedure: Left ankle trimalleolar open reduction internal fixation;  Surgeon: Wylene Simmer, MD;  Location: [place] [facility];  Service: Orthopedics;  Laterality: Left;    There were no vitals filed for this visit.   Subjective Assessment - 01/05/21 0836     Subjective I like the taping. The pain comes and goes. I do the exercises but not as much as I should. I have noticed my back has a lot more pain recently, seems like no rhyme or reason to it and the things that we do in here aren't making it worse. Maybe my ankle is affecting my back.    Currently in Pain? No/denies                                                [facility] Adult PT Treatment/Exercise - 01/05/21 0001       Self-Care   Self-Care Other Self-Care Comments    Other Self-Care Comments  see education      Manual Therapy   Manual Therapy Joint mobilization;Passive ROM;Taping    Joint Mobilization subtalar distraction, inversion, eversion; talocrural distraction and AP/PA mobs    Passive ROM PROM all directions    Kinesiotex Edema      Kinesiotix   Edema 2 fans, one on medial aspect of leg going to anterior ankle and medial malleolus area, one on lateral aspectof leg going to lateral malleolus and anterior ankle      Ankle Exercises: Stretches   Gastroc Stretch 3 reps;30 seconds      Ankle Exercises: Standing   SLS L 3x10" with ball at heels    Other Standing Ankle Exercises tandem only L foot in back 3x30"      Ankle Exercises: Seated   BAPS Level 5;10 reps  front and back, lateral     Ankle Exercises: Supine   T-Band 4way ankle green band x10                          PT Education - 01/05/21 1057     Education Details HEP update, where to get k-tape and how to apply it, possible relation between ankle and back pain    Person(s) Educated Patient    Methods Explanation;Demonstration;Handout    Comprehension Verbalized understanding;Returned demonstration;Need further instruction              PT Short Term Goals - 12/27/20 1116       PT SHORT TERM GOAL #1   Title Pt will be independent with intial HEP.    Status Achieved      PT SHORT TERM GOAL #2   Title PT will go over FOTO results with pt by 3rd visit    Status Unable to assess                PT Long Term Goals - 11/19/20 1123       PT LONG TERM GOAL #1   Title Pt will be able to demonstrate 20 SL calf raises in order to demonstrate proper 5/5 gastroc L MMT    Time 8    Period Weeks    Status New    Target Date 01/14/21      PT LONG TERM GOAL #2   Title Pt will go from a 64% to a 70% FOTO score to show functional improvement     Time 8    Period Weeks    Status New    Target Date 01/14/21      PT LONG TERM GOAL #3   Title Pt will be able to achieve 40 degrees AROM plantarflexion in order to help with ambulation    Time 8    Period Weeks    Status New    Target Date 01/14/21      PT LONG TERM GOAL #4   Title Pt will be able to get to L ankl eversion AROM 10 degrees in order to help with ambulation.    Time 8    Period Weeks    Status New    Target Date 01/14/21      PT LONG TERM GOAL #5   Title Pt will be able to get to 0 degrees of AROM dorsiflexion in order to help with ambulation    Time 8    Period Weeks    Status New    Target Date 01/14/21                        Plan - 01/05/21 1113     Clinical Impression Statement Pt tolerated tx well with no adverse effects. Seated theraband exercises progressed to green band this session. Manual PT performed to increase ankle ROM and k-tape for edema management. SLS introduced and pt only able to tolerate 10 seconds without increase in pain. Heel raises with ball squeezes to isolate tibialis posterior more done, as pt was noted to go into inversion on heel raises. BAPs board introduced and pt has more ROM and control going front to back then laterally. Continues to benefit from skilled PT in order to address ankle ROM and strength and progress towards more functional mobility and strength in order to return to PLOF and increase community ambulation.  PT Treatment/Interventions ADLs/Self Care Home Management;Moist Heat;Cryotherapy;Ultrasound;Gait training;Stair training;Functional mobility training;Therapeutic activities;Therapeutic exercise;Balance training;Compression bandaging;Patient/family education;Manual techniques;Passive range of motion;Scar mobilization;Joint Manipulations;Taping    PT Next Visit Plan standing balance, manual, tape PRN, 4 way ankle, gastroc stretching    PT Home Exercise Plan [email or web address]    Consulted and Agree with Plan of Care  Patient             Patient will benefit from skilled therapeutic intervention in order to improve the following deficits and impairments:  Abnormal gait,Difficulty walking,Pain,Impaired sensation,Decreased strength,Decreased mobility,Hypomobility,Decreased scar mobility,Decreased activity tolerance,Impaired flexibility,Decreased balance  Visit Diagnosis: Stiffness of left ankle, not elsewhere classified  Muscle weakness (generalized)  Pain in left ankle and joints of left foot  Unspecified disturbances of skin sensation      Problem List Patient Active Problem List   Diagnosis Date Noted   Blood in urine 08/28/2017   Hemorrhage of rectum and anus 06/21/2011    Caleb Popp, SPT 01/05/2021, 11:29 AM  [place], [place], [address] Phone: [phone]   Fax:  [phone]  Name: Belinda Morgan MRN: [id number] Date of Birth: 18-Mar-1951

## 2021-01-07 DIAGNOSIS — S82852S Displaced trimalleolar fracture of left lower leg, sequela: Secondary | ICD-10-CM | POA: Diagnosis not present

## 2021-01-10 ENCOUNTER — Encounter: Payer: Self-pay | Admitting: Physical Therapy

## 2021-01-10 ENCOUNTER — Ambulatory Visit: Payer: Medicare PPO | Attending: Orthopedic Surgery | Admitting: Physical Therapy

## 2021-01-10 ENCOUNTER — Other Ambulatory Visit: Payer: Self-pay

## 2021-01-10 DIAGNOSIS — M25672 Stiffness of left ankle, not elsewhere classified: Secondary | ICD-10-CM | POA: Diagnosis not present

## 2021-01-10 DIAGNOSIS — M25572 Pain in left ankle and joints of left foot: Secondary | ICD-10-CM | POA: Diagnosis not present

## 2021-01-10 DIAGNOSIS — R209 Unspecified disturbances of skin sensation: Secondary | ICD-10-CM

## 2021-01-10 DIAGNOSIS — M6281 Muscle weakness (generalized): Secondary | ICD-10-CM

## 2021-01-10 NOTE — Therapy (Addendum)
[place], [place], [address] Phone: [phone]   Fax:  [phone]  Physical Therapy Treatment / ERO / Discharge  Patient Details  Name: Belinda Morgan MRN: [id number] Date of Birth: August 01, 1951 Referring Provider (PT): Wylene Simmer, MD   Encounter Date: 01/10/2021   PT End of Session - 01/10/21 1106     Visit Number 7    Number of Visits 12    Date for PT Re-Evaluation 02/14/21    Authorization Type [employer] MCR    Authorization Time Period 11/19/20 - 01/29/21    Authorization - Visit Number 7    Authorization - Number of Visits 8    Progress Note Due on Visit 10    PT Start Time 0915    PT Stop Time 1000    PT Time Calculation (min) 45 min    Activity Tolerance Patient tolerated treatment well    Behavior During Therapy [facility] for tasks assessed/performed             Past Medical History:  Diagnosis Date   Allergy    Anemia    Hx of    Arthritis    COVID-19 11/29/2019   [facility] and [facility] provide [facility]   GERD (gastroesophageal reflux disease)    Heart murmur    Hyperlipemia    Hypertension    MVP (mitral valve prolapse)    hx of    Past Surgical History:  Procedure Laterality Date   APPENDECTOMY     COLONOSCOPY     laporoscopy     diagnostic   ORIF ANKLE FRACTURE Left 02/19/2020   Procedure: Left ankle trimalleolar open reduction internal fixation;  Surgeon: Wylene Simmer, MD;  Location: [place];  Service: Orthopedics;  Laterality: Left;    There were no vitals filed for this visit.   Subjective Assessment - 01/10/21 0919     Subjective Patient reports the ankle is feeling fine, she states her lower leg has been a little uncomfortable. Patient notes that her back continues to be uncomfortable when she is up moving for longer periods.    Patient Stated Goals just feel better, wear regular pair of shoes, 'be normal'    Currently in Pain? No/denies                 [facility] PT Assessment - 01/10/21 0001       Assessment   Medical Diagnosis Displaced trimalleolar fracture of left lower leg    Referring Provider (PT) Wylene Simmer, MD    Onset Date/Surgical Date 02/19/20      Precautions   Precautions None      Restrictions   Weight Bearing Restrictions No      Balance Screen   Has the patient fallen in the past 6 months No      Prior Function   Level of Independence Independent      Observation/Other Assessments   Focus on Therapeutic Outcomes (FOTO)  62% functional status      AROM   Left Ankle Dorsiflexion -5   lacking   Left Ankle Plantar Flexion 50    Left Ankle Inversion 40    Left Ankle Eversion 12      Strength   Left Ankle Plantar Flexion 4/5      Ambulation/Gait   Ambulation/Gait Yes    Ambulation/Gait Assistance 7: Independent    Gait Comments Early heel lift on left, decreased forward tibial translation, decreased  step- length                           [facility] Adult PT Treatment/Exercise - 01/10/21 0001       Self-Care   Self-Care Other Self-Care Comments    Other Self-Care Comments  POC update, exam findings      Manual Therapy   Manual Therapy Joint mobilization;Soft tissue mobilization;Passive ROM;Taping    Manual therapy comments Edema flush and STM to calf    Joint Mobilization Talocrural distraction and AP to improve dorsiflexion    Passive ROM PROM all directions    Kinesiotex Edema      Kinesiotix   Edema 2 fans      Ankle Exercises: Stretches   Gastroc Stretch 2 reps;30 seconds   standing at Education officer, environmental Stretch 3 reps;30 seconds      Ankle Exercises: Seated   Other Seated Ankle Exercises Ankle T-band 4 way with green x 15 each      Ankle Exercises: Aerobic   Nustep L5 x 5 min with LE only      Ankle Exercises: Standing   Heel Raises 10 reps   2 sets, ball between heels                   PT Education - 01/10/21 1106     Education Details  HEP with focus on calf stretching    Person(s) Educated Patient    Methods Explanation;Demonstration;Verbal cues    Comprehension Verbalized understanding;Need further instruction;Returned demonstration;Verbal cues required              PT Short Term Goals - 12/27/20 1116       PT SHORT TERM GOAL #1   Title Pt will be independent with intial HEP.    Status Achieved      PT SHORT TERM GOAL #2   Title PT will go over FOTO results with pt by 3rd visit    Status Unable to assess               PT Long Term Goals - 01/10/21 1112       PT LONG TERM GOAL #1   Title Pt will be able to demonstrate 20 SL calf raises in order to demonstrate proper 5/5 gastroc L MMT    Baseline Patient unable to perform 20 SL calf raises, grossly 4/5 MMT    Time 5    Period Weeks    Status On-going    Target Date 02/14/21      PT LONG TERM GOAL #2   Title Pt will go from a 64% to a 70% FOTO score to show functional improvement    Baseline 62%    Time 5    Period Weeks    Status On-going    Target Date 02/14/21      PT LONG TERM GOAL #3   Title Pt will be able to achieve 40 degrees AROM plantarflexion in order to help with ambulation    Baseline 50 deg    Status Achieved      PT LONG TERM GOAL #4   Title Pt will be able to get to L ankl eversion AROM 10 degrees in order to help with ambulation.    Baseline 12 deg    Status Achieved      PT LONG TERM GOAL #5   Title Pt will be able to get to 0 degrees of AROM  dorsiflexion in order to help with ambulation    Baseline continues to lack 5 deg from neutral    Time 5    Period Weeks    Status On-going    Target Date 02/14/21                   Plan - 01/10/21 1109     Clinical Impression Statement Patient tolerated therapy well with no adverse effects. She demonstrates improvement in ankle motion but continues to be limited with dorsiflexion and ankle strength resulting in gait deviations and limitations with walking  tolerance. Therapy incorporated further manual therapy to reduce ankle swelling and improve calf flexibility and ankle mobility. Continued with strengthening and stretching exercises with good tolerance. No change to current HEP this visit. Patient is progressing toward established goals and would benefit from continued skilled therapy to address ankle ROM and strength and progress towards more functional mobility and strength in order to return to PLOF and increase community ambulation.    PT Treatment/Interventions ADLs/Self Care Home Management;Moist Heat;Cryotherapy;Ultrasound;Gait training;Stair training;Functional mobility training;Therapeutic activities;Therapeutic exercise;Balance training;Compression bandaging;Patient/family education;Manual techniques;Passive range of motion;Scar mobilization;Joint Manipulations;Taping    PT Next Visit Plan Review HEP and progress PRN, continue manual for ankle mobility, ankle strengthening and balance    PT Home Exercise Plan [email or web address]    Consulted and Agree with Plan of Care Patient             Patient will benefit from skilled therapeutic intervention in order to improve the following deficits and impairments:  Abnormal gait,Difficulty walking,Pain,Impaired sensation,Decreased strength,Decreased mobility,Hypomobility,Decreased scar mobility,Decreased activity tolerance,Impaired flexibility,Decreased balance  Visit Diagnosis: Stiffness of left ankle, not elsewhere classified  Muscle weakness (generalized)  Pain in left ankle and joints of left foot  Unspecified disturbances of skin sensation     Problem List Patient Active Problem List   Diagnosis Date Noted   Blood in urine 08/28/2017   Hemorrhage of rectum and anus 06/21/2011    Hilda Blades, PT, DPT, LAT, ATC 01/10/21  11:26 AM Phone: [phone] Fax: [place], [place], [address] Phone:  [phone]   Fax:  [phone]  Name: ANNALISA COLONNA MRN: [id number] Date of Birth: 09/15/1950   PHYSICAL THERAPY DISCHARGE SUMMARY  Visits from Start of Care: 7  Current functional level related to goals / functional outcomes: See above   Remaining deficits: See above   Education / Equipment: HEP   Patient agrees to discharge. Patient goals were partially met. Patient is being discharged due to not returning since the last visit.

## 2021-01-15 DIAGNOSIS — S82402A Unspecified fracture of shaft of left fibula, initial encounter for closed fracture: Secondary | ICD-10-CM | POA: Diagnosis not present

## 2021-01-15 DIAGNOSIS — S82202A Unspecified fracture of shaft of left tibia, initial encounter for closed fracture: Secondary | ICD-10-CM | POA: Diagnosis not present

## 2021-01-17 ENCOUNTER — Ambulatory Visit: Payer: Medicare PPO | Admitting: Physical Therapy

## 2021-02-15 DIAGNOSIS — S82402A Unspecified fracture of shaft of left fibula, initial encounter for closed fracture: Secondary | ICD-10-CM | POA: Diagnosis not present

## 2021-02-15 DIAGNOSIS — S82202A Unspecified fracture of shaft of left tibia, initial encounter for closed fracture: Secondary | ICD-10-CM | POA: Diagnosis not present

## 2021-03-12 ENCOUNTER — Ambulatory Visit
Admission: EM | Admit: 2021-03-12 | Discharge: 2021-03-12 | Disposition: A | Discharge status: Home / Self Care | Payer: Medicare PPO | Attending: Family Medicine | Admitting: Family Medicine

## 2021-03-12 ENCOUNTER — Other Ambulatory Visit: Payer: Self-pay

## 2021-03-12 DIAGNOSIS — S161XXA Strain of muscle, fascia and tendon at neck level, initial encounter: Secondary | ICD-10-CM | POA: Diagnosis not present
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
## 2021-03-30 DIAGNOSIS — M25512 Pain in left shoulder: Secondary | ICD-10-CM | POA: Diagnosis not present

## 2021-03-30 DIAGNOSIS — M25511 Pain in right shoulder: Secondary | ICD-10-CM | POA: Diagnosis not present

## 2021-03-30 DIAGNOSIS — M5412 Radiculopathy, cervical region: Secondary | ICD-10-CM | POA: Diagnosis not present

## 2021-04-12 DIAGNOSIS — Z20822 Contact with and (suspected) exposure to covid-19: Secondary | ICD-10-CM | POA: Diagnosis not present

## 2021-06-24 DIAGNOSIS — R7309 Other abnormal glucose: Secondary | ICD-10-CM | POA: Diagnosis not present

## 2021-06-24 DIAGNOSIS — Z79899 Other long term (current) drug therapy: Secondary | ICD-10-CM | POA: Diagnosis not present

## 2021-06-24 DIAGNOSIS — G629 Polyneuropathy, unspecified: Secondary | ICD-10-CM | POA: Diagnosis not present

## 2021-07-12 DIAGNOSIS — Z20828 Contact with and (suspected) exposure to other viral communicable diseases: Secondary | ICD-10-CM | POA: Diagnosis not present

## 2022-05-26 IMAGING — CT CT ANKLE*L* W/O CM
3 of 4 series · 13 of 33 positions shown, 16 images · non-contrast
Comparison: Radiographs dated 02/07/2020

CLINICAL DATA: Fractures of the distal tibia and fibula.

EXAM:
CT OF THE LEFT ANKLE WITHOUT CONTRAST
TECHNIQUE: Multidetector CT imaging of the left ankle was performed according
to the standard protocol. Multiplanar CT image reconstructions were
also generated.

[Series 10: coronal st · coronal · 0.34mm/px · 3 of 75 slices shown]
[im 15/75  bone]
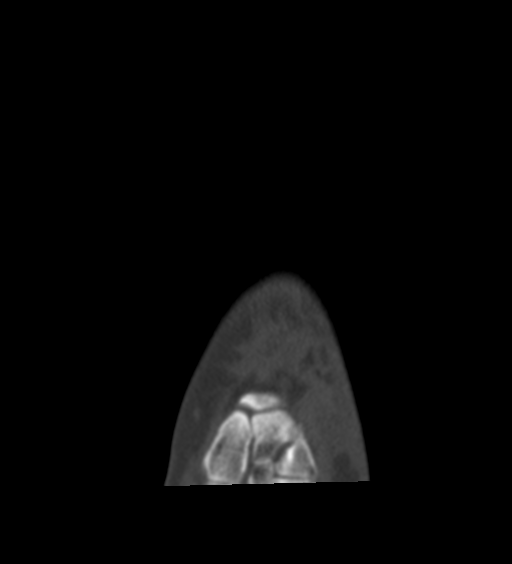
[im 30/75  bone]
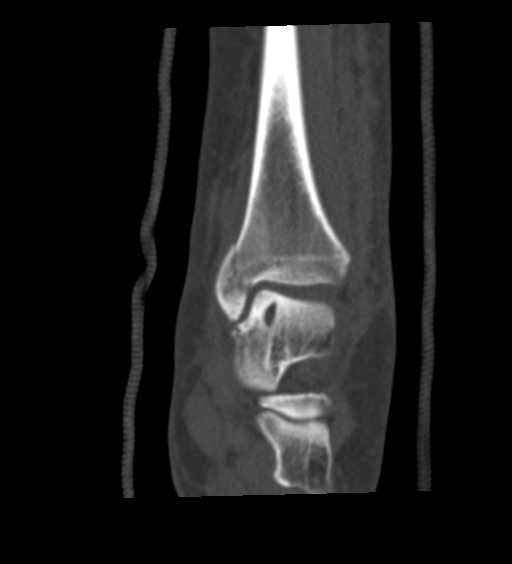
[im 45/75  bone]
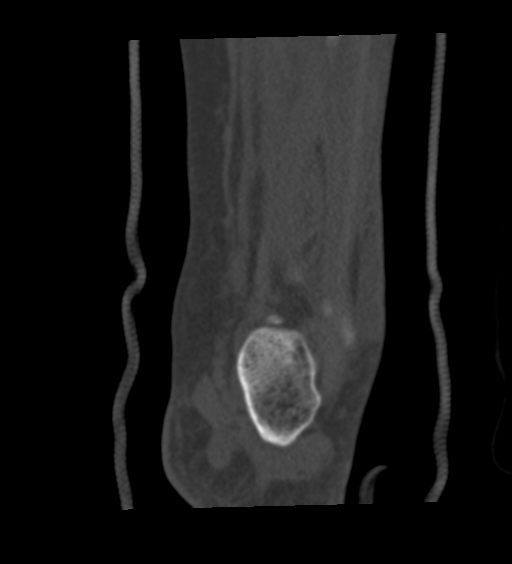

[Series 11: sagittal st · sagittal · 0.29mm/px · 5 of 86 slices shown, 6 images]
[im 29/86  bone]
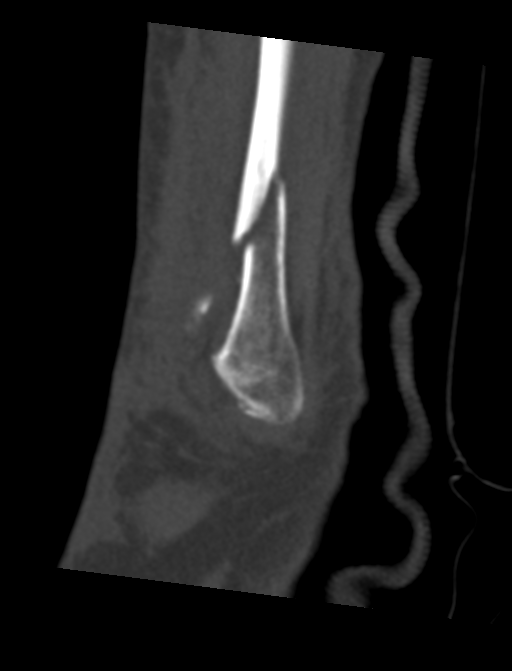
[im 36/86  bone]
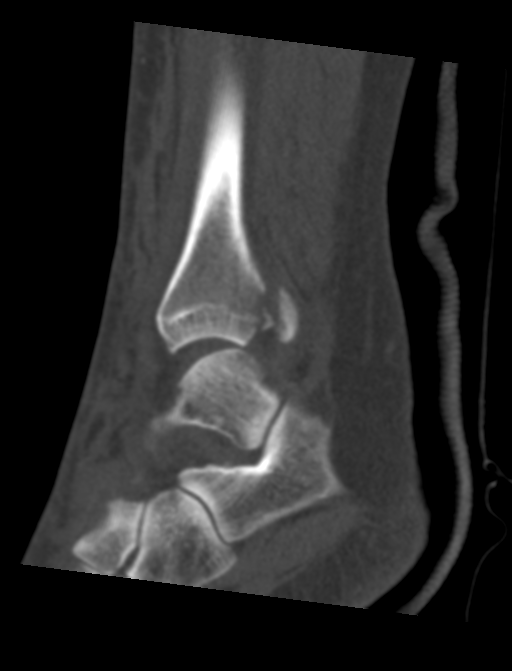
[im 43/86  soft-tissue]
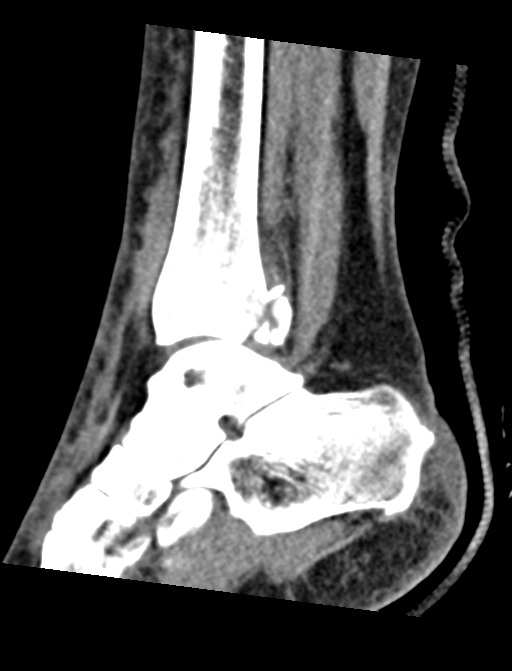
[im 43/86  bone]
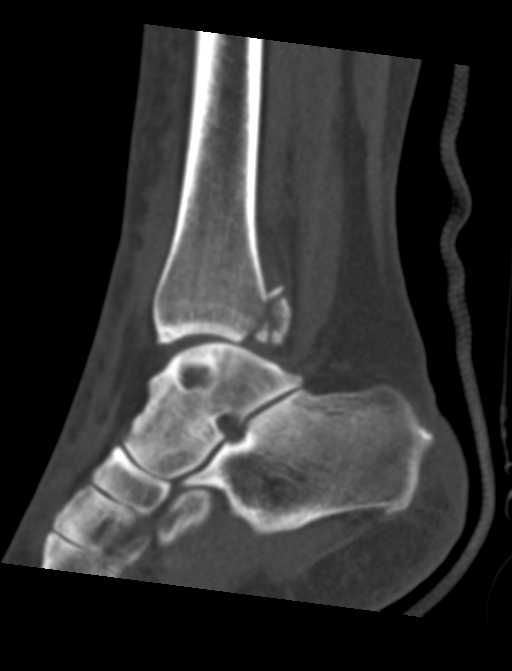
[im 50/86  bone]
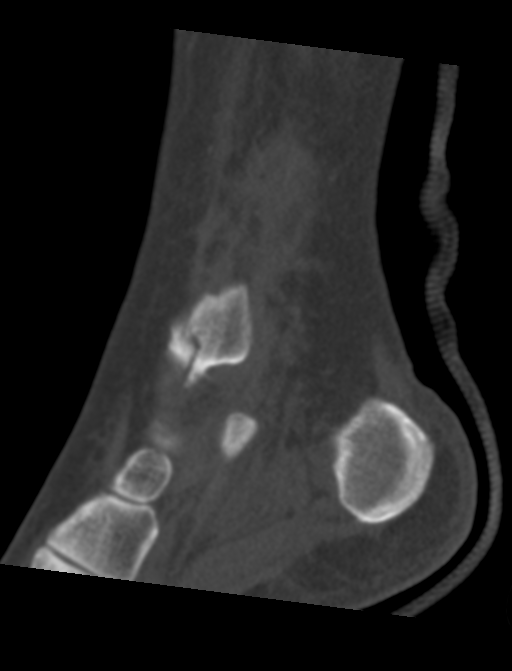
[im 57/86  bone]
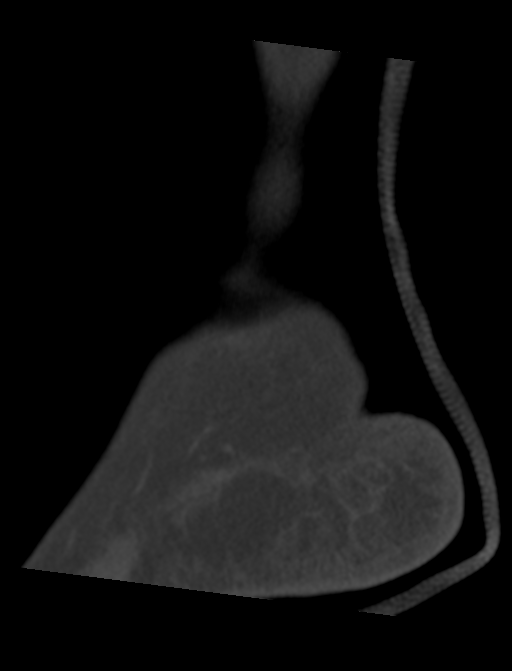

[Series 13: axial recon st · axial · 0.29mm/px · z∈[-253,-124]mm · 5 of 93 slices shown, 7 images]
[im 14/93  soft-tissue]
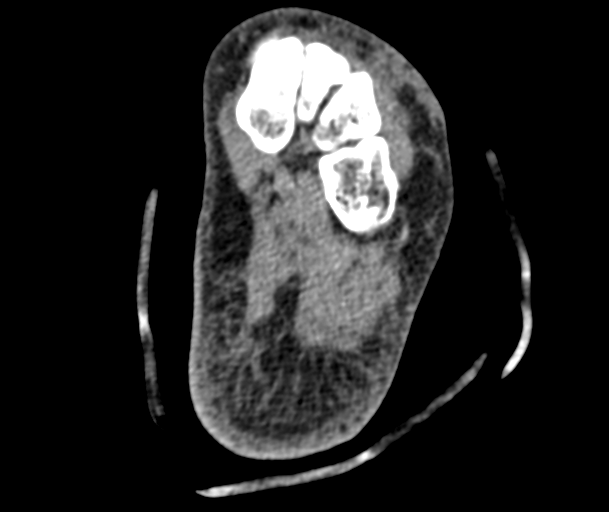
[im 14/93  bone]
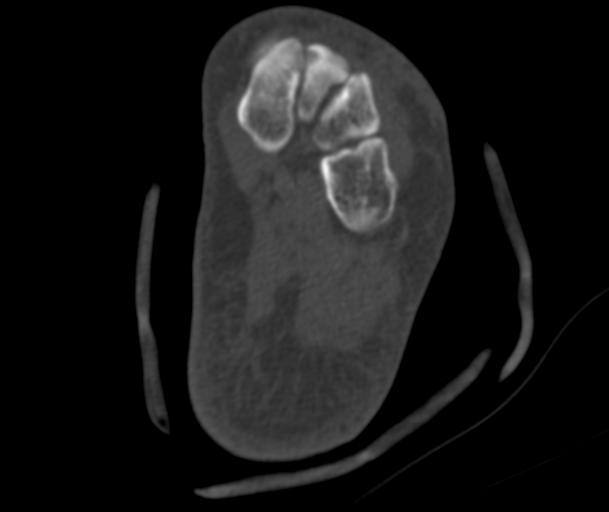
[im 27/93  bone]
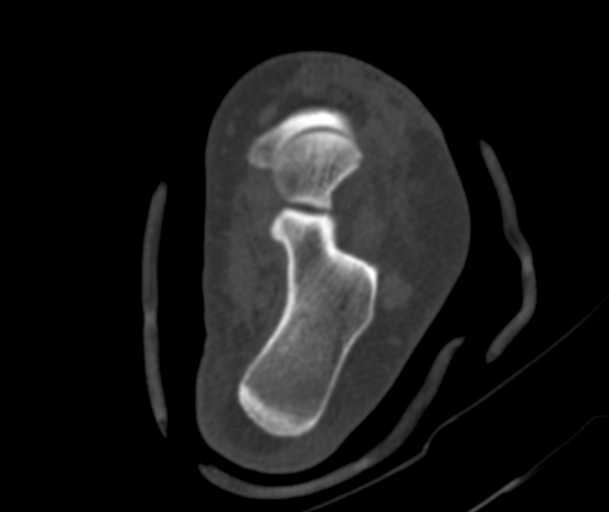
[im 53/93  bone]
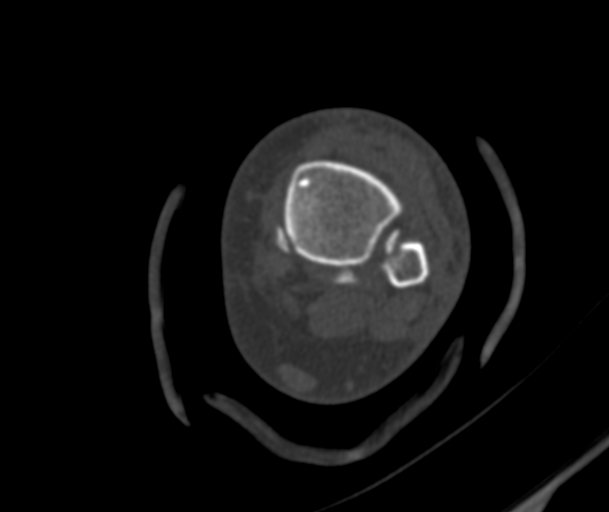
[im 66/93  bone]
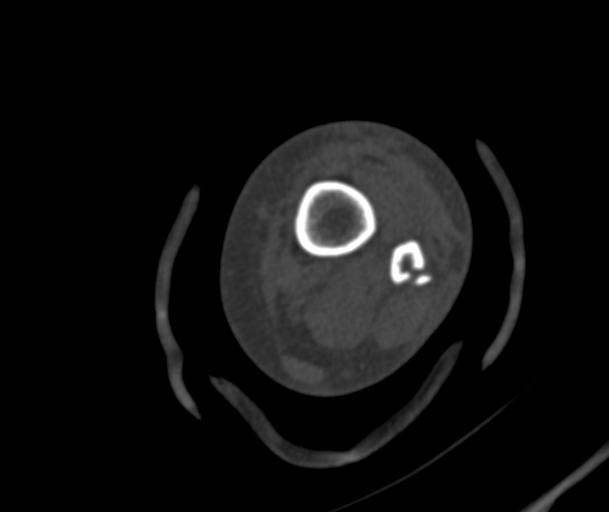
[im 79/93  soft-tissue]
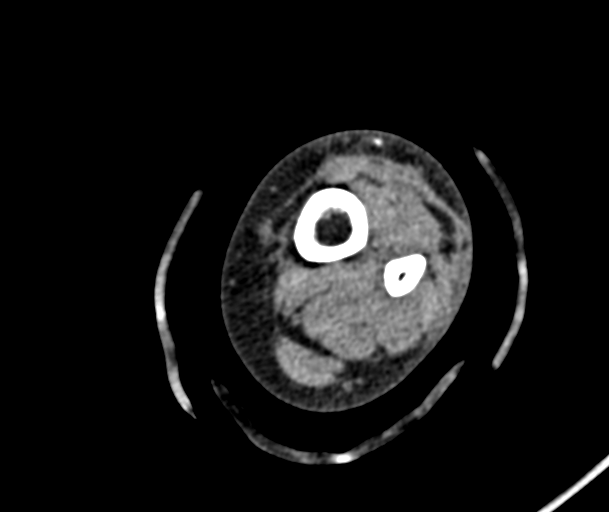
[im 79/93  bone]
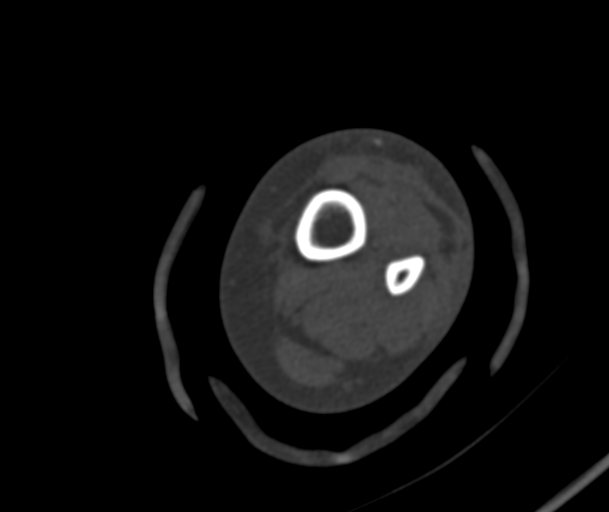

[13 of 33 positions shown; findings below may reference images not displayed]

FINDINGS: Bones/Joint/Cartilage

There is a comminuted fracture of the distal tibia involving the
base of the medial malleolus and the posterior malleolus. Posterior
malleolar component involves approximately 23% of the AP dimension
of the articular surface.

The fracture through the base of the medial malleolus has oblique
and sagittal components with slight impaction of the articular
surface. The foot is slightly subluxed posteriorly with respect to
the distal tibia. There is abnormal widening of the anterior and
lateral ankle joint space.

There is a slightly displaced and distracted spiral fracture of the
distal fibular shaft. There is disruption of the distal tibiofibular
syndesmosis best appreciated on the axial images, series 12.

There are cystic degenerative changes of the medial aspect of the
dome of the talus.

Ligaments

Suboptimally assessed by CT. Ligaments of the ankle are not well
enough seen for assessment.

Muscles and Tendons

No discrete abnormality of the muscles or tendons at the ankle.

Soft tissues

Soft tissue edema or bruising primarily anteriorly and laterally at
the ankle.
IMPRESSION: 1. Comminuted fracture of the distal tibia as described.
2. Fracture of the distal fibular shaft.
3. Widening of the anterior and lateral ankle joint space.
4. Widening of the distal tibiofibular syndesmosis.

## 2022-10-23 ENCOUNTER — Other Ambulatory Visit: Payer: Self-pay | Admitting: Internal Medicine

## 2022-10-23 DIAGNOSIS — E785 Hyperlipidemia, unspecified: Secondary | ICD-10-CM

## 2022-11-24 ENCOUNTER — Ambulatory Visit
Admission: RE | Admit: 2022-11-24 | Discharge: 2022-11-24 | Disposition: A | Discharge status: Home / Self Care | Payer: No Typology Code available for payment source | Source: Ambulatory Visit | Attending: Internal Medicine | Admitting: Internal Medicine

## 2022-11-24 DIAGNOSIS — E785 Hyperlipidemia, unspecified: Secondary | ICD-10-CM

## 2023-02-15 ENCOUNTER — Ambulatory Visit (INDEPENDENT_AMBULATORY_CARE_PROVIDER_SITE_OTHER): Payer: Medicare (Managed Care)

## 2023-02-15 ENCOUNTER — Ambulatory Visit (INDEPENDENT_AMBULATORY_CARE_PROVIDER_SITE_OTHER): Payer: Medicare (Managed Care) | Admitting: Podiatry

## 2023-02-15 DIAGNOSIS — M79672 Pain in left foot: Secondary | ICD-10-CM

## 2023-02-15 DIAGNOSIS — S99919A Unspecified injury of unspecified ankle, initial encounter: Secondary | ICD-10-CM

## 2023-02-15 DIAGNOSIS — M19172 Post-traumatic osteoarthritis, left ankle and foot: Secondary | ICD-10-CM | POA: Diagnosis not present

## 2023-02-15 NOTE — Patient Instructions (Signed)
Call [facility] to schedule your CT at the below locations.  Please allow at least 1 business day after your visit to process the referral.  It may take longer depending on approval from insurance.  Please let me know if you have issues or problems scheduling the CT   [facility] [phone] [address] [place], [place] [address]  [facility] [phone] [address] [place], [place] [address]  

## 2023-02-19 NOTE — Progress Notes (Signed)
  Subjective:  Patient ID: Belinda Morgan, female    DOB: 04-19-1951,  MRN: [id number]  Chief Complaint  Patient presents with   Foot Swelling    np left ankle broken several years ago and still having pain and swelling( second opinion)    72 y.o. female presents with the above complaint. History confirmed with patient.  She suffered a fall and injury on the left ankle, had a trimalleolar fracture that underwent ORIF on 02/19/2020.  No complications healing postoperatively, did several months of PT.  Has noticed over the last few weeks is increasing pain and swelling in the joint  Objective:  Physical Exam: warm, good capillary refill, no trophic changes or ulcerative lesions, normal DP and PT pulses, normal sensory exam, and left ankle range of motion is limited in dorsiflexion, some warmth and edema here and pain to palpation on the anterior joint line, palpable hardware laterally   Radiographs: Multiple views x-ray of left ankle: Moderate arthrosis of the ankle joint with anterior osteophytes developing, rectus alignment, no complication of hardware Assessment:   1. Post-traumatic arthritis of ankle, left      Plan:  Patient was evaluated and treated and all questions answered.  Reviewed the patient's history and discussed with her that my review of the images from her initial injury and ORIF is that she did have an excellent repair of the injury, the arthritis she is experiencing now is secondary to the nature of a trimalleolar fracture and arthritis can be quite common especially at her age.  She does have some palpable prominent hardware but this is not particularly painful for her.  Most of the pain is within the joint.  I recommended a CT scan to evaluate the joint surface further, we also discussed treatment with corticosteroid injection to alleviate pain and inflammation.  Following consent and prepped with Betadine the left ankle was injected with 20 mg of Kenalog and 4 mg of  dexamethasone through medial approach.  She tolerated this well.  I will see her back after the CT for further treatment options.  Return for after CT to review.

## 2023-02-21 ENCOUNTER — Encounter: Payer: Self-pay | Admitting: Podiatry

## 2023-03-09 ENCOUNTER — Telehealth: Payer: Self-pay | Admitting: Podiatry

## 2023-03-09 NOTE — Telephone Encounter (Signed)
I called [employer] to request a peer-to-peer review today for Dr. Vara Guardian ordered CT LEFT ankle without contrast (CPT 73700) for dx code: 19.172 (post-traumatic arthritis left ankle).  She scheduled me for a peer-to-peer review at 8:45am this morning, but it went to voicemail and Dr. Shawnee Knapp did not call the backup number (my cell phone) that I also provided when scheduling.  I called back to the number provided and gave CASE# [id number]:  Was informed, via automated message for this case, that this was denied and will NOT be overturned by a peer-to-peer review.  A review would only be consultory and not to appeal/change their decision.    If you want to appeal further, you must refer to, and follow, the instructions they provided in the denial letter, which I don't have access to.    You must have the case number when calling about this request.  [employer]'s # [id number]

## 2023-03-19 ENCOUNTER — Encounter: Payer: Self-pay | Admitting: Podiatry

## 2023-03-20 ENCOUNTER — Other Ambulatory Visit: Payer: Medicare (Managed Care)

## 2023-05-29 ENCOUNTER — Ambulatory Visit: Payer: Medicare (Managed Care) | Admitting: Podiatry

## 2023-06-19 ENCOUNTER — Ambulatory Visit (INDEPENDENT_AMBULATORY_CARE_PROVIDER_SITE_OTHER): Payer: Medicare (Managed Care) | Admitting: Podiatry

## 2023-06-19 ENCOUNTER — Encounter: Payer: Self-pay | Admitting: Podiatry

## 2023-06-19 VITALS — BP 123/67 | HR 73 | Temp 96.6°F | Resp 18 | Ht 65.0 in | Wt 186.0 lb

## 2023-06-19 DIAGNOSIS — T8484XA Pain due to internal orthopedic prosthetic devices, implants and grafts, initial encounter: Secondary | ICD-10-CM | POA: Diagnosis not present

## 2023-06-19 DIAGNOSIS — M19172 Post-traumatic osteoarthritis, left ankle and foot: Secondary | ICD-10-CM | POA: Diagnosis not present

## 2023-06-19 DIAGNOSIS — G609 Hereditary and idiopathic neuropathy, unspecified: Secondary | ICD-10-CM | POA: Diagnosis not present

## 2023-06-19 NOTE — Progress Notes (Signed)
  Subjective:  Patient ID: Belinda Morgan, female    DOB: 1951/07/24,  MRN: [id number]  Chief Complaint  Patient presents with   Foot Pain    left foot wierd feelings in foot/ numbnuess in toes/ discoloration of foot, PATIENT STATES THAT TOES FEEL NUMB AT TIMES AND CRAMPING.    72 y.o. female presents with the above complaint. History confirmed with patient.  She suffered a fall and injury on the left ankle, had a trimalleolar fracture that underwent ORIF on 02/19/2020.    The patient, with a history of ankle surgery and idiopathic neuropathy, presents with intermittent numbness in the toes, primarily on the left foot but occasionally on the right. She also reports puffiness on the top of the foot, cramps, and a sensation of skin detaching from the foot. The patient describes the sensation as similar to the feeling of a healing burn, with the skin feeling disconnected. The symptoms are exacerbated by walking, particularly if she walks two days in a row. The patient also reports chronic back pain and sciatica, which she manages with occasional use of tramadol. She has been prescribed gabapentin for the neuropathy but has chosen not to take it due to its sedative effects. The patient uses [facility] and KT tape for symptom management. At our last visit an ankle injection was completed which has helped some but her pain persists and is limiting     Objective:  Physical Exam: warm, good capillary refill, no trophic changes or ulcerative lesions, normal DP and PT pulses, light touch sensory exam normal and left ankle range of motion is limited in dorsiflexion, no warmth and edema here today and discomfort over medial hardware, less so on lateral, there is hypertrophic medial scar.   Radiographs: Multiple views x-ray of left ankle: Moderate arthrosis of the ankle joint with anterior osteophytes developing, rectus alignment, no complication of hardware Assessment:   1. Pain due to internal orthopedic  prosthetic device, initial encounter (HCC)   2. Post-traumatic arthritis of ankle, left   3. Idiopathic neuropathy      Plan:  Patient was evaluated and treated and all questions answered.  Assessment & Plan Post-surgical Ankle Pain   We will order a new CT scan to evaluate the left ankle's surgical site and hardware due to persistent discomfort and swelling, localized pain around the surgical scar, and a sensation of skin detachment, especially after walking. Depending on the CT results, we may consider surgical intervention to remove the hardware and clean up scar tissue with arthroscopy. Her injection has offered some relief but she overall is very limited in function and would like to improve. Her xray is insufficient to evaluate the full joint surface and hardware positioning.  Idiopathic Peripheral Neuropathy   For her long-standing diagnosis of idiopathic peripheral neuropathy, characterized by numbness in the toes, a sensation of skin detachment, and occasional burning pain exacerbated by walking and a history of back issues, we will start a trial of topical compound cream from [facility] to manage symptoms. She should continue using [facility] and KT tape as needed for symptom relief.    She will follow up in one month, coinciding with another family member's appointment.  Return in about 1 month (around 07/20/2023).

## 2023-06-19 NOTE — Patient Instructions (Signed)
Call [facility] to schedule your CT at the below locations.  Please allow at least 1 business day after your visit to process the referral.  It may take longer depending on approval from insurance.  Please let me know if you have issues or problems scheduling the MRI   [facility] [phone] [address] [place] [address] [place], [place] [address]  [facility] [phone] [address] [place], [place] [address]

## 2023-07-16 ENCOUNTER — Ambulatory Visit (INDEPENDENT_AMBULATORY_CARE_PROVIDER_SITE_OTHER): Payer: Medicare (Managed Care) | Admitting: Podiatry

## 2023-07-16 ENCOUNTER — Encounter: Payer: Self-pay | Admitting: Podiatry

## 2023-07-16 DIAGNOSIS — S86892A Other injury of other muscle(s) and tendon(s) at lower leg level, left leg, initial encounter: Secondary | ICD-10-CM

## 2023-07-16 DIAGNOSIS — M19172 Post-traumatic osteoarthritis, left ankle and foot: Secondary | ICD-10-CM

## 2023-07-16 DIAGNOSIS — T8484XA Pain due to internal orthopedic prosthetic devices, implants and grafts, initial encounter: Secondary | ICD-10-CM | POA: Diagnosis not present

## 2023-07-16 DIAGNOSIS — M62462 Contracture of muscle, left lower leg: Secondary | ICD-10-CM

## 2023-07-16 DIAGNOSIS — G609 Hereditary and idiopathic neuropathy, unspecified: Secondary | ICD-10-CM | POA: Diagnosis not present

## 2023-07-16 NOTE — Patient Instructions (Signed)
Do exercises exactly as told by your health care provider and adjust them as directed. It is normal to feel mild stretching, pulling, tightness, or discomfort as you do these exercises. Stop the exercise right away if you feel sudden pain or your pain gets worse.   Stretching exercises These exercises improve the movement and flexibility of your calf muscles. These exercises may also help to relieve pain and stiffness. Standing gastroc stretch  This exercise is also called a standing calf (gastroc) stretch. Stand with your hands against a wall. Extend your left / right leg behind you, and bend your front knee slightly. Your heels should be on the floor. Keeping your heels on the floor and your back knee straight, shift your weight toward the wall. You should feel a gentle stretch in the back of your lower leg (calf). Hold this position for 10 seconds. Repeat 10 times. Complete this exercise 2 times a day. Gastroc and soleus stretch, standing This is an exercise in which you stand on a step and use your body weight to stretch your calf muscles. To do this exercise: Stand with the ball of your left / right foot on a step. The ball of your foot is on the walking surface, right under your toes. Keep your other foot firmly on the same step. Hold on to the wall, a railing, or a chair for balance. Slowly lift your other foot, allowing your body weight to press your left / right heel down over the edge of the step. You should feel a stretch in your left / right calf. Hold this position for 10 seconds. Return both feet to the step. Repeat this exercise with a slight bend in your left / right knee. Repeat 10 times. Complete this exercise 2 times a day. Strengthening exercise This exercise builds strength and endurance in your foot muscles and may help to take pressure off your heel. Endurance is the ability to use your muscles for a long time, even after they get tired. Arch lifts This exercise is  sometimes called foot intrinsics. This is an exercise in which you lift the arch part of your foot only. To do this exercise: Sit in a chair with your feet flat on the floor. Keeping your big toe and your heel on the floor, lift only your arch, which is on the inner edge of your left / right foot. Do not move your knee or scrunch your toes. This is a small movement. Hold this position for 10 seconds. Return to the starting position. Repeat 10 times. Complete this exercise 2 times a day.  

## 2023-07-16 NOTE — Progress Notes (Signed)
  Subjective:  Patient ID: Belinda Morgan, female    DOB: May 02, 1951,  MRN: [id number]  Chief Complaint  Patient presents with   Foot Pain    "It's doing okay."    72 y.o. female returns for follow-up the CT has not been scheduled yet, she did receive the compound cream but so far has not had much of a chance to use it yet she only had the pain once since last visit last week and she did not have the cream with her at the time.  She is getting pain pulling up into the lower leg on the front as well.   Objective:  Physical Exam: warm, good capillary refill, no trophic changes or ulcerative lesions, normal DP and PT pulses, light touch sensory exam normal and left ankle range of motion is limited in dorsiflexion, no warmth and edema here today and discomfort over medial hardware, less so on lateral, there is hypertrophic medial scar.  Equinus is present  Radiographs: Multiple views x-ray of left ankle: Moderate arthrosis of the ankle joint with anterior osteophytes developing, rectus alignment, no complication of hardware Assessment:   1. Post-traumatic arthritis of ankle, left   2. Pain due to internal orthopedic prosthetic device, initial encounter (HCC)   3. Idiopathic neuropathy   4. Gastrocnemius equinus of left lower extremity   5. Left medial tibial stress syndrome, initial encounter      Plan:  Patient was evaluated and treated and all questions answered.  Assessment & Plan  I discussed with the imaging center the CT scan and there was a scheduling error, they have addressed this and are going to reach out to her to schedule it is currently in review with her insurance since it was denied previously, the imaging center will let me know if there are any issues that needs an appeal or peer to peer.  Follow-up with me after the CT scan.  Continue utilize the compounded cream as needed to see if this is helpful.  We discussed she is having pain coming up the leg in the front which  seems to be consistent with a shin splint which I think is secondary to her equinus contracture.  I discussed with her home physical therapy for stretching to alleviate this and she will do this on her own and let me know if she has any issues if she needs formal physical therapy  Return for follow up after CT scan.

## 2023-07-18 ENCOUNTER — Encounter: Payer: Self-pay | Admitting: Podiatry

## 2023-07-23 ENCOUNTER — Emergency Department (HOSPITAL_COMMUNITY): Payer: Medicare (Managed Care)

## 2023-07-23 ENCOUNTER — Emergency Department (HOSPITAL_COMMUNITY)
Admission: EM | Admit: 2023-07-23 | Discharge: 2023-07-23 | Disposition: A | Discharge status: Home / Self Care | Payer: Medicare (Managed Care) | Attending: Emergency Medicine | Admitting: Emergency Medicine

## 2023-07-23 ENCOUNTER — Other Ambulatory Visit: Payer: Self-pay

## 2023-07-23 DIAGNOSIS — E876 Hypokalemia: Secondary | ICD-10-CM | POA: Diagnosis not present

## 2023-07-23 DIAGNOSIS — D72829 Elevated white blood cell count, unspecified: Secondary | ICD-10-CM | POA: Diagnosis not present

## 2023-07-23 DIAGNOSIS — Z79899 Other long term (current) drug therapy: Secondary | ICD-10-CM | POA: Diagnosis not present

## 2023-07-23 DIAGNOSIS — R1030 Lower abdominal pain, unspecified: Secondary | ICD-10-CM | POA: Diagnosis present

## 2023-07-23 DIAGNOSIS — R112 Nausea with vomiting, unspecified: Secondary | ICD-10-CM | POA: Insufficient documentation

## 2023-07-23 DIAGNOSIS — N390 Urinary tract infection, site not specified: Secondary | ICD-10-CM | POA: Insufficient documentation

## 2023-07-23 DIAGNOSIS — I10 Essential (primary) hypertension: Secondary | ICD-10-CM | POA: Insufficient documentation

## 2023-07-23 LAB — URINALYSIS, ROUTINE W REFLEX MICROSCOPIC
Bilirubin Urine: NEGATIVE
Glucose, UA: NEGATIVE mg/dL
Ketones, ur: NEGATIVE mg/dL
Nitrite: NEGATIVE
Protein, ur: NEGATIVE mg/dL
Specific Gravity, Urine: >1.046 — ABNORMAL HIGH (ref 1.005–1.030)
pH: 6 (ref 5.0–8.0)

## 2023-07-23 LAB — CBC
HCT: 40.3 % (ref 36.0–46.0)
Hemoglobin: 13.8 g/dL (ref 12.0–15.0)
MCH: 29.9 pg (ref 26.0–34.0)
MCHC: 34.2 g/dL (ref 30.0–36.0)
MCV: 87.4 fL (ref 80.0–100.0)
Platelets: 308 10*3/uL (ref 150–400)
RBC: 4.61 MIL/uL (ref 3.87–5.11)
RDW: 13.4 % (ref 11.5–15.5)
WBC: 17 10*3/uL — ABNORMAL HIGH (ref 4.0–10.5)
nRBC: 0 % (ref 0.0–0.2)

## 2023-07-23 LAB — COMPREHENSIVE METABOLIC PANEL
ALT: 29 U/L (ref 0–44)
AST: 34 U/L (ref 15–41)
Albumin: 4 g/dL (ref 3.5–5.0)
Alkaline Phosphatase: 70 U/L (ref 38–126)
Anion gap: 15 (ref 5–15)
BUN: 16 mg/dL (ref 8–23)
CO2: 20 mmol/L — ABNORMAL LOW (ref 22–32)
Calcium: 9.8 mg/dL (ref 8.9–10.3)
Chloride: 102 mmol/L (ref 98–111)
Creatinine, Ser: 0.96 mg/dL (ref 0.44–1.00)
GFR, Estimated: >60 mL/min (ref >60)
Glucose, Bld: 122 mg/dL — ABNORMAL HIGH (ref 70–99)
Potassium: 3.1 mmol/L — ABNORMAL LOW (ref 3.5–5.1)
Sodium: 137 mmol/L (ref 135–145)
Total Bilirubin: 0.9 mg/dL (ref <1.2)
Total Protein: 7.1 g/dL (ref 6.5–8.1)

## 2023-07-23 LAB — LIPASE, BLOOD: Lipase: 24 U/L (ref 11–51)

## 2023-07-23 MED ORDER — IOHEXOL 350 MG/ML SOLN
75.0000 mL | Freq: Once | INTRAVENOUS | Status: AC | PRN
  Administered 2023-07-23: 75 mL via INTRAVENOUS

## 2023-07-23 MED ORDER — CEFPODOXIME PROXETIL 200 MG PO TABS: 200.0000 mg | ORAL_TABLET | Freq: Two times a day (BID) | ORAL | 0 refills | Status: AC

## 2023-07-23 MED ORDER — ONDANSETRON 4 MG PO TBDP
4.0000 mg | ORAL_TABLET | Freq: Once | ORAL | Status: AC | PRN
  Administered 2023-07-23: 4 mg via ORAL
  Filled 2023-07-23: qty 1

## 2023-07-23 MED ORDER — HYDROCODONE-ACETAMINOPHEN 5-325 MG PO TABS
1.0000 | ORAL_TABLET | Freq: Once | ORAL | Status: AC
  Administered 2023-07-23: 1 via ORAL
  Filled 2023-07-23: qty 1

## 2023-07-23 MED ORDER — SODIUM CHLORIDE 0.9 % IV SOLN
1.0000 g | Freq: Once | INTRAVENOUS | Status: AC
  Administered 2023-07-23: 1 g via INTRAVENOUS
  Filled 2023-07-23: qty 10

## 2023-07-23 MED ORDER — ONDANSETRON HCL 4 MG PO TABS: 4.0000 mg | ORAL_TABLET | Freq: Three times a day (TID) | ORAL | 0 refills | Status: DC | PRN

## 2023-07-23 NOTE — Discharge Instructions (Addendum)
Today you were seen for right flank pain.  Please pick up your antibiotic and take as prescribed.  You may also take Zofran for nausea every 8 hours as needed thank you for letting us treat you today. After reviewing your labs and imaging, I feel you are safe to go home. Please follow up with your PCP in the next several days and provide them with your records from this visit. Return to the Emergency Room if pain becomes severe or symptoms worsen.

## 2023-07-23 NOTE — ED Provider Triage Note (Signed)
Emergency Medicine Provider Triage Evaluation Note  Belinda Morgan , a 72 y.o. female  was evaluated in triage.  Pt complains of right flank pain, nausea, vomiting.  Review of Systems  Positive: Nausea, vomiting, right flank pain radiating to right groin, chills Negative: Dysuria, hematuria, diarrhea, fever  Physical Exam  BP (!) 117/93 (BP Location: Right Arm)   Pulse 83   Temp 97.9 F (36.6 C) (Oral)   Resp (!) 24   SpO2 98%  Gen:   Awake, oriented, moderate pain Resp:  Normal effort  MSK:   Moves extremities without difficulty  Other:    Medical Decision Making  Medically screening exam initiated at 12:13 PM.  Appropriate orders placed.  Raniya H Mccrory was informed that the remainder of the evaluation will be completed by another provider, this initial triage assessment does not replace that evaluation, and the importance of remaining in the ED until their evaluation is complete.     Dolphus Jenny, PA-C 07/23/23 1216

## 2023-07-23 NOTE — ED Provider Notes (Signed)
[place] EMERGENCY DEPARTMENT AT [facility] Provider Note   CSN: [id number] Arrival date & time: 07/23/23  1201     History  Chief Complaint  Patient presents with   Abdominal Pain    Belinda Morgan is a 72 y.o. female past medical history significant for GERD, hypertension, anemia, and hyperlipidemia presents today for lower abdominal pain with nausea and emesis x 2 since this morning.  Patient states that her pain is greater on the right side than left and wraps around to the right flank.  She states that the pain is dull constantly and intermittently sharp.  She also endorses chills.  Patient denies dysuria hematuria, diarrhea, or fever.  Patient states that she previously had appendectomy.   Abdominal Pain Associated symptoms: nausea and vomiting        Home Medications Prior to Admission medications   Medication Sig Start Date End Date Taking? Authorizing Provider  acetaminophen (TYLENOL) 500 MG tablet Take 1,000 mg by mouth daily as needed for moderate pain (pain score 4-6), headache or fever.   Yes [provider]  amLODipine (NORVASC) 5 MG tablet Take 5 mg by mouth at bedtime.   Yes [provider]  cefpodoxime (VANTIN) 200 MG tablet Take 1 tablet (200 mg total) by mouth 2 (two) times daily for 10 days. 07/23/23 08/02/23 Yes Dolphus Jenny, PA-C  ondansetron (ZOFRAN) 4 MG tablet Take 1 tablet (4 mg total) by mouth every 8 (eight) hours as needed for nausea or vomiting. 07/23/23  Yes Dolphus Jenny, PA-C  senna (SENOKOT) 8.6 MG TABS tablet Take 8.6 mg by mouth daily as needed (constipation).   Yes [provider]  traMADol (ULTRAM) 50 MG tablet Take 50 mg by mouth daily as needed for severe pain (pain score 7-10).   Yes [provider]  triamterene-hydrochlorothiazide (DYAZIDE) 37.5-25 MG capsule Take 1 capsule by mouth at bedtime. 08/08/17  Yes [provider]      Allergies    Asa [aspirin] and E-mycin  [erythromycin]    Review of Systems   Review of Systems  Gastrointestinal:  Positive for abdominal pain, nausea and vomiting.  Genitourinary:  Positive for flank pain.    Physical Exam Updated Vital Signs BP (!) 107/59   Pulse 78   Temp 98 F (36.7 C) (Oral)   Resp 17   SpO2 95%  Physical Exam Vitals and nursing note reviewed.  Constitutional:      General: She is not in acute distress.    Appearance: She is well-developed.  HENT:     Head: Normocephalic and atraumatic.  Eyes:     Conjunctiva/sclera: Conjunctivae normal.  Cardiovascular:     Rate and Rhythm: Normal rate and regular rhythm.     Heart sounds: No murmur heard. Pulmonary:     Effort: Pulmonary effort is normal. No respiratory distress.     Breath sounds: Normal breath sounds.  Abdominal:     General: Abdomen is flat. Bowel sounds are decreased.     Palpations: Abdomen is soft.     Tenderness: There is abdominal tenderness in the right lower quadrant and suprapubic area. There is right CVA tenderness.  Musculoskeletal:        General: No swelling.     Cervical back: Neck supple.  Skin:    General: Skin is warm and Morgan.     Capillary Refill: Capillary refill takes less than 2 seconds.  Neurological:     General: No focal deficit present.  Mental Status: She is alert and oriented to person, place, and time.  Psychiatric:        Mood and Affect: Mood normal.     ED Results / Procedures / Treatments   Labs (all labs ordered are listed, but only abnormal results are displayed) Labs Reviewed  COMPREHENSIVE METABOLIC PANEL - Abnormal; Notable for the following components:      Result Value   Potassium 3.1 (*)    CO2 20 (*)    Glucose, Bld 122 (*)    All other components within normal limits  CBC - Abnormal; Notable for the following components:   WBC 17.0 (*)    All other components within normal limits  URINALYSIS, ROUTINE W REFLEX MICROSCOPIC - Abnormal; Notable for the following components:    Specific Gravity, Urine >1.046 (*)    Hgb urine dipstick SMALL (*)    Leukocytes,Ua SMALL (*)    Bacteria, UA RARE (*)    All other components within normal limits  LIPASE, BLOOD    EKG None  Radiology CT ABDOMEN PELVIS W CONTRAST  Result Date: 07/23/2023 CLINICAL DATA:  Abdominal and flank pain. EXAM: CT ABDOMEN AND PELVIS WITH CONTRAST TECHNIQUE: Multidetector CT imaging of the abdomen and pelvis was performed using the standard protocol following bolus administration of intravenous contrast. RADIATION DOSE REDUCTION: This exam was performed according to the departmental dose-optimization program which includes automated exposure control, adjustment of the mA and/or kV according to patient size and/or use of iterative reconstruction technique. CONTRAST:  75mL OMNIPAQUE IOHEXOL 350 MG/ML SOLN COMPARISON:  None Available. FINDINGS: Lower Chest: No acute findings. Hepatobiliary: No suspicious hepatic masses identified. Benign-appearing cyst noted in the inferior right hepatic lobe. Gallbladder is unremarkable. No evidence of biliary ductal dilatation. Pancreas:  No mass or inflammatory changes. Spleen: Within normal limits in size and appearance. Adrenals/Urinary Tract: No suspicious masses identified. No evidence of ureteral calculi or hydronephrosis. Unremarkable unopacified urinary bladder. Stomach/Bowel: No evidence of obstruction, inflammatory process or abnormal fluid collections. Diffuse colonic diverticulosis noted, however, there are no signs of diverticulitis. Vascular/Lymphatic: No pathologically enlarged lymph nodes. No acute vascular findings. Reproductive: Multiple calcified uterine fibroids are seen, largest measuring 6.6 cm. Adnexal regions are unremarkable. Other:  Small umbilical hernia seen, which contains only fat. Musculoskeletal:  No suspicious bone lesions identified. IMPRESSION: No acute findings. Colonic diverticulosis, without radiographic evidence of diverticulitis. Multiple  calcified uterine fibroids. Small umbilical hernia, which contains only fat. Electronically Signed   By: Danae Orleans M.D.   On: 07/23/2023 16:15    Procedures Procedures    Medications Ordered in ED Medications  cefTRIAXone (ROCEPHIN) 1 g in sodium chloride 0.9 % 100 mL IVPB (has no administration in time range)  ondansetron (ZOFRAN-ODT) disintegrating tablet 4 mg (4 mg Oral Given 07/23/23 1204)  HYDROcodone-acetaminophen (NORCO/VICODIN) 5-325 MG per tablet 1 tablet (1 tablet Oral Given 07/23/23 1221)  iohexol (OMNIPAQUE) 350 MG/ML injection 75 mL (75 mLs Intravenous Contrast Given 07/23/23 1350)    ED Course/ Medical Decision Making/ A&P                                 Medical Decision Making Amount and/or Complexity of Data Reviewed Labs: ordered. Radiology: ordered.  Risk Prescription drug management.   This patient presents to the ED with chief complaint(s) of right abdominal/flank pain with pertinent past medical history of GERD, hypertension and hyperlipidemia which further complicates the presenting complaint. The  complaint involves an extensive differential diagnosis and also carries with it a high risk of complications and morbidity.    The differential diagnosis includes gastroenteritis, kidney stone, pyelonephritis, UTI  Additional history obtained: Records reviewed Primary Care Documents  ED Course and Reassessment:   Independent labs interpretation:  The following labs were independently interpreted:  CBC: Leukocytosis at 17 CMP: Mild hypokalemia 3.1, mildly decreased CO2 at 20 Lipase: 24 UA: Small hemoglobin, small leuks, elevated specific gravity, rare bacteria, 6-10 WBCs  Independent visualization of imaging: - I independently visualized the following imaging with scope of interpretation limited to determining acute life threatening conditions related to emergency care: CT abdomen pelvis with contrast, which revealed colonic diverticulosis, multiple  calcified uterine fibroids, small fat-containing umbilical hernia  Consultation: - Consulted or discussed management/test interpretation w/ external professional: None  Consideration for admission or further workup:  Considered for admission further workup however patient states she is feeling much better.  Patient's physical exam, labs, and imaging were all reassuring.  Patient will be treated outpatient for presumed UTI.  If patient's symptoms persist she feels comfortable having follow-up with her PCP.        Final Clinical Impression(s) / ED Diagnoses Final diagnoses:  Urinary tract infection with hematuria, site unspecified    Rx / DC Orders ED Discharge Orders          Ordered    cefpodoxime (VANTIN) 200 MG tablet  2 times daily        07/23/23 1636    ondansetron (ZOFRAN) 4 MG tablet  Every 8 hours PRN        07/23/23 1637              Dolphus Jenny, PA-C 07/23/23 1650    Long, Arlyss Repress, MD 08/02/23 1506

## 2023-07-23 NOTE — ED Triage Notes (Signed)
Patient via EMS with constant lower abdominal pain with nausea and emesis x 2 since 0700 this morning. Pain R>L. Went to [facility] for evaluation of same and they gave her 1g Rocephin d/t fever of 102.6 at the office. Denies diarrhea or dysuria.

## 2023-07-25 ENCOUNTER — Encounter: Payer: Self-pay | Admitting: Podiatry

## 2023-08-08 ENCOUNTER — Other Ambulatory Visit: Payer: Medicare Other

## 2023-08-20 ENCOUNTER — Ambulatory Visit
Admission: RE | Admit: 2023-08-20 | Discharge: 2023-08-20 | Disposition: A | Discharge status: Home / Self Care | Payer: Medicare (Managed Care) | Source: Ambulatory Visit | Attending: Podiatry

## 2023-08-20 DIAGNOSIS — M19172 Post-traumatic osteoarthritis, left ankle and foot: Secondary | ICD-10-CM

## 2023-09-13 ENCOUNTER — Encounter: Payer: Self-pay | Admitting: Podiatry

## 2023-09-13 ENCOUNTER — Ambulatory Visit (INDEPENDENT_AMBULATORY_CARE_PROVIDER_SITE_OTHER): Payer: Medicare Other | Admitting: Podiatry

## 2023-09-13 DIAGNOSIS — G609 Hereditary and idiopathic neuropathy, unspecified: Secondary | ICD-10-CM | POA: Diagnosis not present

## 2023-09-13 DIAGNOSIS — M19172 Post-traumatic osteoarthritis, left ankle and foot: Secondary | ICD-10-CM

## 2023-09-14 NOTE — Progress Notes (Signed)
  Subjective:  Patient ID: Belinda Morgan, female    DOB: 1950/11/22,  MRN: [id number]  Chief Complaint  Patient presents with   Foot Pain    Patient is here for CT results    73 y.o. female returns for follow-up she completed the CT she notes that the ankle itself is not particularly painful but she has a feeling of tightness around the foot that comes on quickly.   Objective:  Physical Exam: warm, good capillary refill, no trophic changes or ulcerative lesions, normal DP and PT pulses, light touch sensory exam normal and left ankle range of motion is limited in dorsiflexion, no warmth and edema here today she has no pain over the hardware now there is hypertrophic medial scar.  Equinus is present  Radiographs: Multiple views x-ray of left ankle: Moderate arthrosis of the ankle joint with anterior osteophytes developing, rectus alignment, no complication of hardware Assessment:   1. Post-traumatic arthritis of ankle, left   2. Idiopathic neuropathy       Plan:  Patient was evaluated and treated and all questions answered.  Assessment & Plan  We reviewed the results of the CT scan and her arthritis and hardware. Currently the hardware is not painful and considering the significant arthritic changes present on the CT scan she has relatively few symptoms of this. Most of her current pain and discomfort is mostly neuropathic in nature and I do not think that hardware removal would improve this at this point. She will follow up with me as needed if the hardware pain returns or if her ankle arthritis progress and she requires injection, bracing or surgical intervention for this as well   Return if symptoms worsen or fail to improve.

## 2024-02-05 ENCOUNTER — Ambulatory Visit: Admitting: Podiatry

## 2024-03-06 ENCOUNTER — Encounter: Payer: Self-pay | Admitting: Podiatry

## 2024-03-06 ENCOUNTER — Ambulatory Visit (INDEPENDENT_AMBULATORY_CARE_PROVIDER_SITE_OTHER): Admitting: Podiatry

## 2024-03-06 VITALS — Ht 65.0 in | Wt 186.0 lb

## 2024-03-06 DIAGNOSIS — M19072 Primary osteoarthritis, left ankle and foot: Secondary | ICD-10-CM | POA: Diagnosis not present

## 2024-03-06 DIAGNOSIS — G609 Hereditary and idiopathic neuropathy, unspecified: Secondary | ICD-10-CM

## 2024-03-06 DIAGNOSIS — M19172 Post-traumatic osteoarthritis, left ankle and foot: Secondary | ICD-10-CM

## 2024-03-10 ENCOUNTER — Encounter: Payer: Self-pay | Admitting: Podiatry

## 2024-03-10 NOTE — Progress Notes (Signed)
  Subjective:  Patient ID: Belinda Morgan, female    DOB: [birth year],  MRN: [id number]  Chief Complaint  Patient presents with   Foot Pain    Pt is here due to left foot/ankle swelling and pain states she has been here for the same issue has neuropathy but this feels different, has a shooting pain in the foot in the mornings, also states that it feels like her skin is pulling so tight     73 y.o. female returns for follow-up she is still dealing with significant discomfort and limitations in the left foot and ankle she often has swelling and discomfort and sensations of pulling and shooting pain across the top of the foot  Objective:  Physical Exam: warm, good capillary refill, no trophic changes or ulcerative lesions, normal DP and PT pulses, light touch sensory exam normal and left ankle range of motion is limited in dorsiflexion, pain to palpation on the joint, she has tenderness and swelling over the midfoot as well equinus is present  Radiographs: Multiple views x-ray of left ankle: Moderate arthrosis of the ankle joint with anterior osteophytes developing, rectus alignment, no complication of hardware Assessment:   1. Idiopathic neuropathy   2. Post-traumatic arthritis of ankle, left   3. Arthritis of left midfoot       Plan:  Patient was evaluated and treated and all questions answered.  Assessment & Plan  Has not had much improvement so far.  We discussed that her issues are likely multifactorial and I do think neuropathy is still a factor.  She had EMG and NCV quite a long time ago and would like to be reevaluated with this as well.  Referral will be placed for this.  We discussed the limitations of the study that it is often technically difficult to evaluate polyneuropathy as opposed to nerve lesions damage or mononeuropathy, also discussed that often it does not lead to complete management changes.  Referral sent to [facility].  Regarding her remaining  issues pain and swelling she has midfoot arthritis as well.  She feels that this pain is different than the ankle pain she gets from the hardware and the injury.  Recommended foot and ankle MRI to see if this gives us  any more information that her CT scan did not.  No follow-ups on file.

## 2024-03-19 ENCOUNTER — Telehealth: Payer: Self-pay

## 2024-03-19 NOTE — Telephone Encounter (Signed)
-----   Message from Juliene JONELLE Medicine sent at 03/10/2024  6:46 AM EDT ----- This is a [facility] neurology referral for an EMG/NCV if you can send.  If they are not doing them can send somewhere else

## 2024-03-19 NOTE — Telephone Encounter (Signed)
 Patient is scheduled at [facility] on 04/24/24

## 2024-04-02 ENCOUNTER — Ambulatory Visit
Admission: RE | Admit: 2024-04-02 | Discharge: 2024-04-02 | Disposition: A | Discharge status: Home / Self Care | Payer: Medicare (Managed Care) | Source: Ambulatory Visit | Attending: Podiatry | Admitting: Podiatry

## 2024-04-02 ENCOUNTER — Ambulatory Visit
Admission: RE | Admit: 2024-04-02 | Discharge: 2024-04-02 | Disposition: A | Discharge status: Home / Self Care | Payer: Medicare (Managed Care) | Source: Ambulatory Visit | Attending: Podiatry

## 2024-04-02 DIAGNOSIS — M19172 Post-traumatic osteoarthritis, left ankle and foot: Secondary | ICD-10-CM

## 2024-04-02 DIAGNOSIS — M19072 Primary osteoarthritis, left ankle and foot: Secondary | ICD-10-CM

## 2024-04-07 ENCOUNTER — Ambulatory Visit: Payer: Self-pay | Admitting: Podiatry

## 2024-04-24 ENCOUNTER — Telehealth: Payer: Self-pay | Admitting: Neurology

## 2024-04-24 ENCOUNTER — Ambulatory Visit (INDEPENDENT_AMBULATORY_CARE_PROVIDER_SITE_OTHER): Admitting: Neurology

## 2024-04-24 ENCOUNTER — Encounter: Payer: Self-pay | Admitting: Neurology

## 2024-04-24 VITALS — BP 123/69 | HR 79 | Ht 65.0 in | Wt 182.2 lb

## 2024-04-24 DIAGNOSIS — R202 Paresthesia of skin: Secondary | ICD-10-CM | POA: Diagnosis not present

## 2024-04-24 DIAGNOSIS — M48062 Spinal stenosis, lumbar region with neurogenic claudication: Secondary | ICD-10-CM | POA: Insufficient documentation

## 2024-04-24 DIAGNOSIS — R200 Anesthesia of skin: Secondary | ICD-10-CM

## 2024-04-24 DIAGNOSIS — R29898 Other symptoms and signs involving the musculoskeletal system: Secondary | ICD-10-CM | POA: Diagnosis not present

## 2024-04-24 DIAGNOSIS — M5442 Lumbago with sciatica, left side: Secondary | ICD-10-CM

## 2024-04-24 DIAGNOSIS — G8929 Other chronic pain: Secondary | ICD-10-CM

## 2024-04-24 NOTE — Progress Notes (Signed)
 [facility]    Provider:  Dr Ines Requesting Provider: Silva Juliene SAUNDERS, DPM Primary Care Provider:  Loreli Elsie JONETTA Mickey., MD  CC:  Sensory changes left foot and ankle  HPI:  Belinda Morgan is a 73 y.o. female here as requested by Silva Juliene SAUNDERS, DPM for sensory changes in left foot and ankle. Evaluated by [facility] for the same in 11/11/2022. Reviewed Dr. Georgene notes, neuromuscular specialist, patient had a fall where she slipped and broke/dislocated her left ankle, this happened in June 2021, she had an open reduction fixation, after the fall she began to feel a sock on the bottom of the foot sensation with constant numbness, she was told that she likely has nerve damage by her surgeon, numbness is spread upward over the years to the mid calf, she was taking gabapentin but did not help significantly and uses tramadol very rarely.  The left foot symptoms are mainly in the medial sole of the foot but sometimes extending up into the medial ankle calf; paresthesias seem to be coming up the left leg more so in the last few months.  In addition, she reports of bilateral history of neuropathy with intermittent bilateral lower extremity symptoms triggered by stress or walking several miles, after walking several miles she will have tingling burning itching in the legs, symptoms are helped by ice, may last an hour, may wake her up out of sleep, they are very intermittent.  No significant weakness in the foot.  Also chronic back issues daily pain just standing occasionally may shoot down the back of the legs.  Ongoing 10 years.  No continuous burning tingling.  No history of heavy alcohol  use.  Diabetes in 2023 with 6.6.  No chemo.  EMG nerve conduction study was obtained and showed a mild generalized sensory neuropathy.  No clear evidence for superimposed left lumbosacral radiculopathy.  Numbness is stable.  She had a big workup, 6.6 hemoglobin A1c, TSH normal, FLC are normal, B12  358, SPEP no M spike, IFE SPEP no M spike, and EMG nerve conduction study showed very mild generalized sensory neuropathy no clear evidence for superimposed left lumbosacral radiculopathy.  Likely sustained nerve damage due to her significant ankle injury superimposed on a very mild generalized sensory neuropathy.  Symptoms have not changed since seeing [facility]. She states she has been pre-diabetic for many years and neuropathy for 15 years itching and burning, stabbing, she had back pain at the same time, in 2021 the accident made it worse fractured ankle in 4 places. Now she reports chronic low back pain, when she is walking she will feel the tingling in her toes and burning and numbness and stabbing,  she has some occ itching and burning in the right foot, sleeping can wake her up, back pain is constant.  As she is walking that first mile makes her back hurt. No weakness.   Reviewed notes, labs and imaging from outside physicians, which showed:   MRi lumbar spine 2011: MRI LUMBAR SPINE WITHOUT CONTRAST    Technique:  Multiplanar and multiecho pulse sequences of the lumbar  spine were obtained without intravenous contrast.    Comparison: None    Findings: There is no abnormality at L1-2 or above.  The discs are  normal.  The canal and foramina are widely patent.  The distal cord  and conus are normal.    At L2-3, the disc shows desiccation and mild bulging.  There is no  compressive stenosis.  At L3-4, the disc shows desiccation and mild bulging.  There is no  compressive stenosis.    At L4-5, there is bilateral facet arthropathy with anterolisthesis  of 4 mm. The ligaments are hypertrophic. The disc is degenerated  and shows circumferential protrusion, more prominent towards the  left.  There is severe stenosis of the canal and foramina at this  level.    At L5-S1, the disc bulges mildly.  There is mild facet degeneration  but no slippage.  No apparent neural compression.     Incidental note is made of some leiomyomas of the uterus.    IMPRESSION:  Severe canal and foraminal stenosis at L4-5 because of facet  arthropathy with 5 mm of anterolisthesis, ligamentous hypertrophy  and circumferential protrusion of disc material.   Mri left ankle and foot: reviewed report IMPRESSION: Postsurgical changes of the tibiotalar posttraumatic arthrosis as above. No significant joint effusion or significant edema is appreciated. No evidence of hardware failure. IMPRESSION: Unremarkable MRI of the left forefoot. No accelerated arthrosis or acute abnormality.  Reviewed blood work from July 23, 2019 for: CBC was abnormal with elevated white blood cells at 17 otherwise normal, CMP was abnormal with potassium which was low at 3.1, CO2 low at 20, glucose elevated at 122, otherwise normal.,  Labs June 24, 2021 showed normal B12 at 358, protein electrophoresis normal, hemoglobin A1c 6.6, TSH normal, immunofixation unremarkable polyclonal, free light chains normal  Review of Systems: Patient complains of symptoms per HPI as well as the following symptoms per hpi. Pertinent negatives and positives per HPI. All others negative.   Social History   Socioeconomic History   Marital status: Married    Spouse name: Not on file   Number of children: 3   Years of education: Not on file   Highest education level: Not on file  Occupational History   Occupation: Clinical Social Worker  Tobacco Use   Smoking status: Former    Current packs/day: 0.00    Types: Cigarettes    Quit date: 09/07/1991    Years since quitting: 32.6   Smokeless tobacco: Never  Vaping Use   Vaping status: Never Used  Substance and Sexual Activity   Alcohol  use: No   Drug use: No   Sexual activity: Not Currently  Other Topics Concern   Not on file  Social History Narrative   3 caffeine drinks daily   Lives with family    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 Insecurity: Not on file  Transportation Needs: Not on file  Physical Activity: Not on file  Stress: Not on file  Social Connections: Not on file  Intimate Partner Violence: Not on file    Family History  Problem Relation Age of Onset   Cancer Mother    Diabetes Brother        x 3    Colon polyps Brother    Colon cancer Neg Hx    Esophageal cancer Neg Hx    Rectal cancer Neg Hx    Stomach cancer Neg Hx    Neuropathy Neg Hx     Past Medical History:  Diagnosis Date   Allergy    Anemia    Hx of    Arthritis    COVID-19 11/29/2019   tested + at [facility], and unable to provide result   GERD (gastroesophageal reflux disease)    Heart murmur    Hyperlipemia  Hypertension    MVP (mitral valve prolapse)    hx of    Patient Active Problem List   Diagnosis Date Noted   Spinal stenosis of lumbar region with neurogenic claudication 04/24/2024   Intermittent tingling sensation of left hand and foot 04/24/2024   Left leg weakness 04/24/2024   Blood in urine 08/28/2017   Hemorrhage of rectum and anus 06/21/2011    Past Surgical History:  Procedure Laterality Date   APPENDECTOMY     COLONOSCOPY     laporoscopy     diagnostic   ORIF ANKLE FRACTURE Left 02/19/2020   Procedure: Left ankle trimalleolar open reduction internal fixation;  Surgeon: Kit Rush, MD;  Location: [facility];  Service: Orthopedics;  Laterality: Left;    Current Outpatient Medications  Medication Sig Dispense Refill   acetaminophen  (TYLENOL ) 500 MG tablet Take 1,000 mg by mouth daily as needed for moderate pain (pain score 4-6), headache or fever.     senna (SENOKOT) 8.6 MG TABS tablet Take 8.6 mg by mouth daily as needed (constipation).     traMADol (ULTRAM) 50 MG tablet Take 50 mg by mouth daily as needed for severe pain (pain score 7-10).     triamterene-hydrochlorothiazide (DYAZIDE) 37.5-25 MG capsule Take 1 capsule by mouth at bedtime.  1   No current  facility-administered medications for this visit.    Allergies as of 04/24/2024 - Review Complete 04/24/2024  Allergen Reaction Noted   Asa [aspirin] Other (See Comments) 11/29/2011   E-mycin [erythromycin] Other (See Comments) 05/09/2012    Vitals: BP 123/69   Pulse 79   Ht 5' 5 (1.651 m)   Wt 182 lb 3.2 oz (82.6 kg)   BMI 30.32 kg/m  Last Weight:  Wt Readings from Last 1 Encounters:  04/24/24 182 lb 3.2 oz (82.6 kg)   Last Height:   Ht Readings from Last 1 Encounters:  04/24/24 5' 5 (1.651 m)     Physical exam: Exam: Gen: NAD, conversant, well nourised, obese, well groomed                     CV: RRR, no MRG. No Carotid Bruits. No peripheral edema, warm, nontender Eyes: Conjunctivae clear without exudates or hemorrhage  Neuro: Detailed Neurologic Exam  Speech:    Speech is normal; fluent and spontaneous with normal comprehension.  Cognition:    The patient is oriented to person, place, and time;     recent and remote memory intact;     language fluent;     normal attention, concentration,     fund of knowledge Cranial Nerves:    The pupils are equal, round, and reactive to light. Pupils too small to visualize fundi Visual fields are full to finger confrontation. Extraocular movements are intact. Trigeminal sensation is intact and the muscles of mastication are normal. The face is symmetric. The palate elevates in the midline. Hearing intact. Voice is normal. Shoulder shrug is normal. The tongue has normal motion without fasciculations.   Coordination: nml  Gait: nml  Motor Observation:    No asymmetry, no atrophy, and no involuntary movements noted. Tone:    Normal muscle tone.    Posture:    Posture is normal. normal erect    Strength: left leg flexion weaakness otherwise strength is V/V in the upper and lower limbs.      Sensation: numbness left L5 distribution lower leg below the knee     Reflex Exam:  DTR's: left Aj  absent otherwise normal for  age.  Toes:    The toes are equiv bilaterally.   Clonus:    Clonus is absent.    Assessment/Plan:  73 y.o. female here as requested by Silva Juliene SAUNDERS, DPM for sensory changes in left foot and ankle.  Patient had a workup by [facility] where an EMG nerve conduction study was ordered and serum testing.  Patient had left ankle injury in 2021 however she states the symptoms predate her ankle surgery.  She reports constant numbness, she was told she likely has nerve damage due to the surgery, however numbness predated and spread upward over the years to the mid calf.  MRI left ankle showed extensive hardware.  Patient has numbness in the left L5 distribution in the lower leg below the knee.  She also has absent left AJ. This corresponds to L5 distribution and MRI lumbar the spine in the past showed severe spinal stenosis L4/L5 and this may be what has been causing her left lower extremity symptoms; given her MRI findings in the past(IMPRESSION: Severe canal and foraminal stenosis at L4-5 because of facet arthropathy with 5 mm of anterolisthesis, ligamentous hypertrophy and circumferential protrusion of disc material. ) need to reimage her back.   She has been very active which is probably helped with dampening significant progression and she can walk without claudication, more back pain and sensory changes than neurogenic claudication, so may consider trying to give ESI first prior to considering decompression.  But we reviewed MRI images, spent an extended time discussing it with her, showed her dermatomes of the lower extremity on pictures online, we will repeat MRI of the lumbar spine.  Then we can consider epidural steroid injections and neurosurgical evaluation.  in addition probably to contributions from the injury to her left ankle, the brake and generalized neuropathy due to diabetes as the likely primary factor  Will see when she gets the MRI of the lumbar spine and I will have her back to review  with her.   Orders Placed This Encounter  Procedures   MR LUMBAR SPINE WO CONTRAST   No orders of the defined types were placed in this encounter.   Cc: Silva Juliene SAUNDERS, DPM,  Loreli Elsie JONETTA Mickey., MD  Onetha Epp, MD  [facility] [address] [place], [place] [address]  I spent 63 minutes of face-to-face and non-face-to-face time with patient on the  1. Numbness and tingling of left leg   2. Spinal stenosis of lumbar region with neurogenic claudication   3. Chronic bilateral low back pain with left-sided sciatica   4. Left leg weakness   5. Intermittent tingling sensation of left hand and foot    diagnosis.  This included previsit chart review, lab review, study review, order entry, electronic health record documentation, patient education on the different diagnostic and therapeutic options, counseling and coordination of care, risks and benefits of management, compliance, or risk factor reduction   Phone [phone] Fax [phone]

## 2024-04-24 NOTE — Telephone Encounter (Signed)
 no auth required sent to GI [phone]

## 2024-04-24 NOTE — Patient Instructions (Addendum)
 MRI lumbar spine   Spinal Stenosis  Spinal stenosis is a condition that happens when the spinal canal narrows. The spinal canal is the space between the bones of your spine (vertebrae). This narrowing puts pressure on the spinal cord and nerves that exit the spine and run down the arms or legs. When nerves exiting the spine are pinched, it can cause pain, numbness, or weakness in the arms or legs. Spinal stenosis can affect the vertebrae in the neck, upper back, and lower back. Spinal stenosis can range from mild to severe. What are the causes? This condition is caused by areas of bone pushing into the spinal canal. This condition may be present at birth (congenital), or it may be caused by: Slow breakdown of your vertebrae (spinal degeneration). This usually starts between 56 and 2 years of age. Injury (trauma) to your spine. Previous spinal surgery. Tumors in your spine. Calcium deposits in your spine. What increases the risk? The following factors may make you more likely to develop this condition: Being older than age 22. Being born with an abnormally shaped spine (congenitalspinal deformity), such as scoliosis. Having arthritis. What are the signs or symptoms? Symptoms of this condition include: Pain in the back that is generally worse with activities, particularly when standing or walking. Numbness, tingling, hot or cold sensations, weakness, or tiredness (fatigue) in your legs. This can happen in one  leg, or both. Pain going from the buttock, down the thigh, and to the calf (sciatica) or feet. This can happen in one or both legs. Falling frequently. Foot drop. This is when you have trouble lifting the front part of your foot and it drags on the ground when you walk. This can lead to muscle weakness. In more severe cases, you may develop: Problems having a bowel movement or urinating. Difficulty having sex. Loss of feeling in your legs and inability to walk. Symptoms may come on  slowly and get worse over time. In some cases, there are no symptoms. How is this diagnosed? This condition is diagnosed based on your medical history and a physical exam. You may also have tests, such as an X-ray, CT scan, or MRI. How is this treated? Treatment for this condition often focuses on managing your pain and any other symptoms. Treatment may include: Practicing good posture to lessen pressure on your nerves. Exercises to strengthen muscles, build endurance, improve balance, and maintain range of motion. This may include physical therapy to restore movement and strength to your back. Losing weight, if needed. Medicines to reduce inflammation or pain. This may include a medicine that is injected into your spine (steroidinjection). Assistive devices, such as a corset or brace. In some cases, surgery may be needed. The most common procedure is decompression laminectomy. This removes excess bone that puts pressure on your nerve roots. Follow these instructions at home: Managing pain, stiffness, and swelling  Practice good posture. If you were given a brace or a corset, wear it as told by your health care provider. Maintain a healthy weight. Talk with your health care provider if you need help losing weight. If directed, apply heat to the affected area as often as told by your health care provider. Use the heat source that your health care provider recommends, such as a moist heat pack or a heating pad. Place a towel between your skin and the heat source. Leave the heat on for 20-30 minutes. If your skin turns bright red, remove the heat right away to prevent burns.  The risk of burns is higher if you cannot feel pain, heat, or cold. Activity Do all exercises and stretches as told by your health care provider. Do not do any activities that cause pain. You may have to avoid lifting. Ask your health care provider how much you can safely lift. Return to your normal activities as told by  your health care provider. Ask your health care provider what activities are safe for you. General instructions Take over-the-counter and prescription medicines only as told by your health care provider. Do not use any products that contain nicotine or tobacco. These products include cigarettes, chewing tobacco, and vaping devices, such as e-cigarettes. If you need help quitting, ask your health care provider. Eat a healthy diet. This includes plenty of fruits and vegetables, whole grains, and low-fat (lean) protein. Where to find more information [employer] of Arthritis and Musculoskeletal and Skin Diseases: www.niams.http://www.myers.net/ Contact a health care provider if: Your symptoms do not get better or they get worse. You have a fever. Get help right away if: You have new pain or symptoms of severe pain, such as: New or worsening pain in your neck or upper back. Severe pain that cannot be controlled with medicines. A severe headache that gets worse when you stand. You are dizzy. You have vision problems, such as blurred vision or double vision. You have nausea or vomiting. You develop new or worsening numbness or tingling in your back or legs. You lose control of your bowels or bladder. You have pain, redness, swelling, or warmth in your arm or leg. These symptoms may be an emergency. Get help right away. Call 911. Do not wait to see if the symptoms will go away. Do not drive yourself to the hospital. Summary Spinal stenosis is a condition that happens when the spinal canal narrows, putting pressure on the spinal cord or nerves that exit the vertebrae. This condition is caused by areas of bone pushing into the spinal canal. Spinal stenosis can cause numbness, weakness, or pain in the buttocks, neck, back, arms, and legs. This condition is usually diagnosed with your medical history, a physical exam, and tests, such as an X-ray, CT scan, or MRI. This information is not intended to  replace advice given to you by your health care provider. Make sure you discuss any questions you have with your health care provider. Document Revised: 11/22/2021 Document Reviewed: 11/22/2021 Elsevier Patient Education  2024 [employer].

## 2024-04-26 ENCOUNTER — Encounter: Payer: Self-pay | Admitting: Neurology

## 2024-05-14 ENCOUNTER — Ambulatory Visit
Admission: RE | Admit: 2024-05-14 | Discharge: 2024-05-14 | Disposition: A | Discharge status: Home / Self Care | Source: Ambulatory Visit | Attending: Neurology | Admitting: Neurology

## 2024-05-14 DIAGNOSIS — R202 Paresthesia of skin: Secondary | ICD-10-CM

## 2024-05-14 DIAGNOSIS — R29898 Other symptoms and signs involving the musculoskeletal system: Secondary | ICD-10-CM

## 2024-05-14 DIAGNOSIS — G8929 Other chronic pain: Secondary | ICD-10-CM

## 2024-05-14 DIAGNOSIS — R200 Anesthesia of skin: Secondary | ICD-10-CM | POA: Diagnosis not present

## 2024-05-14 DIAGNOSIS — M48062 Spinal stenosis, lumbar region with neurogenic claudication: Secondary | ICD-10-CM | POA: Diagnosis not present

## 2024-05-15 ENCOUNTER — Ambulatory Visit: Payer: Self-pay | Admitting: Neurology

## 2024-05-15 NOTE — Progress Notes (Signed)
 Can you call and see if she wants to be referred so I can send her to neurosurgery please? Thank you!: Belinda Morgan, You have severe spinal canal stenosis. In addition you have pinched nerves on the left side of your low back that can be causing your leg symptoms. I really think you should be evaluate by a neurosirgeon. Would it be ok if I referred you? Thank you!!

## 2024-10-06 ENCOUNTER — Ambulatory Visit

## 2024-10-14 ENCOUNTER — Ambulatory Visit

## 2024-10-21 ENCOUNTER — Ambulatory Visit
